# Patient Record
Sex: Male | Born: 1985 | Race: White | Hispanic: No | Marital: Single | Smoking: Current every day smoker
Health system: Southern US, Community
[De-identification: ages and names within clinical notes are randomized; demographics above are authoritative.]

## PROBLEM LIST (undated history)

## (undated) DIAGNOSIS — M5126 Other intervertebral disc displacement, lumbar region: Secondary | ICD-10-CM

## (undated) HISTORY — DX: Other intervertebral disc displacement, lumbar region: M51.26

---
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
## 2005-07-17 ENCOUNTER — Encounter: Admission: RE | Admit: 2005-07-17 | Discharge: 2005-10-15 | Payer: Self-pay | Admitting: Neurosurgery

## 2007-05-05 ENCOUNTER — Ambulatory Visit (HOSPITAL_COMMUNITY): Admission: RE | Admit: 2007-05-05 | Discharge: 2007-05-05 | Payer: Self-pay | Admitting: Internal Medicine

## 2007-05-11 ENCOUNTER — Emergency Department (HOSPITAL_COMMUNITY): Admission: EM | Admit: 2007-05-11 | Discharge: 2007-05-11 | Payer: Self-pay | Admitting: Emergency Medicine

## 2009-01-11 ENCOUNTER — Inpatient Hospital Stay (HOSPITAL_COMMUNITY): Admission: EM | Admit: 2009-01-11 | Discharge: 2009-01-15 | Payer: Self-pay | Admitting: Emergency Medicine

## 2009-04-26 ENCOUNTER — Ambulatory Visit (HOSPITAL_COMMUNITY): Admission: RE | Admit: 2009-04-26 | Discharge: 2009-04-26 | Payer: Self-pay | Admitting: Otolaryngology

## 2010-04-12 ENCOUNTER — Ambulatory Visit (HOSPITAL_BASED_OUTPATIENT_CLINIC_OR_DEPARTMENT_OTHER)
Admission: RE | Admit: 2010-04-12 | Discharge: 2010-04-12 | Disposition: A | Discharge status: Home / Self Care | Payer: Worker's Compensation | Attending: Orthopedic Surgery | Admitting: Orthopedic Surgery

## 2010-04-12 DIAGNOSIS — M659 Synovitis and tenosynovitis, unspecified: Secondary | ICD-10-CM | POA: Insufficient documentation

## 2010-04-12 DIAGNOSIS — X58XXXA Exposure to other specified factors, initial encounter: Secondary | ICD-10-CM | POA: Insufficient documentation

## 2010-04-12 DIAGNOSIS — Z87828 Personal history of other (healed) physical injury and trauma: Secondary | ICD-10-CM | POA: Insufficient documentation

## 2010-04-12 DIAGNOSIS — M24473 Recurrent dislocation, unspecified ankle: Secondary | ICD-10-CM | POA: Insufficient documentation

## 2010-04-12 DIAGNOSIS — M65979 Unspecified synovitis and tenosynovitis, unspecified ankle and foot: Secondary | ICD-10-CM | POA: Insufficient documentation

## 2010-04-12 DIAGNOSIS — Q742 Other congenital malformations of lower limb(s), including pelvic girdle: Secondary | ICD-10-CM | POA: Insufficient documentation

## 2010-04-12 DIAGNOSIS — Z01812 Encounter for preprocedural laboratory examination: Secondary | ICD-10-CM | POA: Insufficient documentation

## 2010-04-12 DIAGNOSIS — D1620 Benign neoplasm of long bones of unspecified lower limb: Secondary | ICD-10-CM | POA: Insufficient documentation

## 2010-04-12 LAB — POCT HEMOGLOBIN-HEMACUE: Hemoglobin: 17.4 g/dL — ABNORMAL HIGH (ref 13.0–17.0)
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
## 2010-06-14 LAB — BASIC METABOLIC PANEL
BUN: 10 mg/dL (ref 6–23)
CO2: 26 mEq/L (ref 19–32)
Calcium: 8.8 mg/dL (ref 8.4–10.5)
Chloride: 102 mEq/L (ref 96–112)
Creatinine, Ser: 0.95 mg/dL (ref 0.4–1.5)
GFR calc Af Amer: >60 mL/min (ref >60)
GFR calc non Af Amer: >60 mL/min (ref >60)
Glucose, Bld: 117 mg/dL — ABNORMAL HIGH (ref 70–99)
Potassium: 3.9 mEq/L (ref 3.5–5.1)
Sodium: 135 mEq/L (ref 135–145)

## 2010-06-14 LAB — COMPREHENSIVE METABOLIC PANEL
ALT: 19 U/L (ref 0–53)
AST: 26 U/L (ref 0–37)
Albumin: 4.4 g/dL (ref 3.5–5.2)
Alkaline Phosphatase: 53 U/L (ref 39–117)
BUN: 9 mg/dL (ref 6–23)
CO2: 26 mEq/L (ref 19–32)
Calcium: 9.4 mg/dL (ref 8.4–10.5)
Chloride: 105 mEq/L (ref 96–112)
Creatinine, Ser: 1.14 mg/dL (ref 0.4–1.5)
GFR calc Af Amer: >60 mL/min (ref >60)
GFR calc non Af Amer: >60 mL/min (ref >60)
Glucose, Bld: 121 mg/dL — ABNORMAL HIGH (ref 70–99)
Potassium: 3.4 mEq/L — ABNORMAL LOW (ref 3.5–5.1)
Sodium: 139 mEq/L (ref 135–145)
Total Bilirubin: 1 mg/dL (ref 0.3–1.2)
Total Protein: 7.2 g/dL (ref 6.0–8.3)

## 2010-06-14 LAB — CBC
HCT: 41.3 % (ref 39.0–52.0)
HCT: 44.3 % (ref 39.0–52.0)
Hemoglobin: 14.3 g/dL (ref 13.0–17.0)
Hemoglobin: 15.6 g/dL (ref 13.0–17.0)
MCHC: 34.8 g/dL (ref 30.0–36.0)
MCHC: 35.3 g/dL (ref 30.0–36.0)
MCV: 90.6 fL (ref 78.0–100.0)
MCV: 91.5 fL (ref 78.0–100.0)
Platelets: 217 10*3/uL (ref 150–400)
Platelets: 261 10*3/uL (ref 150–400)
RBC: 4.51 MIL/uL (ref 4.22–5.81)
RBC: 4.89 MIL/uL (ref 4.22–5.81)
RDW: 12.5 % (ref 11.5–15.5)
RDW: 13 % (ref 11.5–15.5)
WBC: 12.5 10*3/uL — ABNORMAL HIGH (ref 4.0–10.5)
WBC: 9.6 10*3/uL (ref 4.0–10.5)

## 2010-06-14 LAB — TYPE AND SCREEN
ABO/RH(D): A NEG
Antibody Screen: NEGATIVE

## 2010-06-14 LAB — POCT I-STAT, CHEM 8
BUN: 9 mg/dL (ref 6–23)
Calcium, Ion: 1.03 mmol/L — ABNORMAL LOW (ref 1.12–1.32)
Chloride: 106 mEq/L (ref 96–112)
Creatinine, Ser: 0.9 mg/dL (ref 0.4–1.5)
Glucose, Bld: 118 mg/dL — ABNORMAL HIGH (ref 70–99)
HCT: 45 % (ref 39.0–52.0)
Hemoglobin: 15.3 g/dL (ref 13.0–17.0)
Potassium: 3.5 mEq/L (ref 3.5–5.1)
Sodium: 139 mEq/L (ref 135–145)
TCO2: 23 mmol/L (ref 0–100)

## 2010-06-14 LAB — PROTIME-INR
INR: 1.09 (ref 0.00–1.49)
Prothrombin Time: 14 seconds (ref 11.6–15.2)

## 2010-06-14 LAB — APTT: aPTT: 26 seconds (ref 24–37)

## 2010-06-14 LAB — ETHANOL: Alcohol, Ethyl (B): <5 mg/dL (ref 0–10)

## 2010-06-14 LAB — ABO/RH: ABO/RH(D): A NEG

## 2010-06-14 LAB — LACTIC ACID, PLASMA: Lactic Acid, Venous: 1.9 mmol/L (ref 0.5–2.2)
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
## 2010-10-11 ENCOUNTER — Ambulatory Visit (HOSPITAL_BASED_OUTPATIENT_CLINIC_OR_DEPARTMENT_OTHER)
Admission: RE | Admit: 2010-10-11 | Discharge: 2010-10-11 | Disposition: A | Discharge status: Home / Self Care | Payer: Worker's Compensation | Source: Ambulatory Visit | Attending: Orthopedic Surgery | Admitting: Orthopedic Surgery

## 2010-10-11 DIAGNOSIS — M7750 Other enthesopathy of unspecified foot: Secondary | ICD-10-CM | POA: Insufficient documentation

## 2010-10-11 DIAGNOSIS — M25579 Pain in unspecified ankle and joints of unspecified foot: Secondary | ICD-10-CM | POA: Insufficient documentation

## 2010-10-11 DIAGNOSIS — Z01812 Encounter for preprocedural laboratory examination: Secondary | ICD-10-CM | POA: Insufficient documentation

## 2010-10-11 DIAGNOSIS — Y9269 Other specified industrial and construction area as the place of occurrence of the external cause: Secondary | ICD-10-CM | POA: Insufficient documentation

## 2010-10-11 DIAGNOSIS — X58XXXS Exposure to other specified factors, sequela: Secondary | ICD-10-CM | POA: Insufficient documentation

## 2010-10-11 LAB — POCT HEMOGLOBIN-HEMACUE: Hemoglobin: 17.9 g/dL — ABNORMAL HIGH (ref 13.0–17.0)
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
## 2015-02-10 ENCOUNTER — Encounter: Payer: Self-pay | Admitting: Internal Medicine

## 2015-02-10 ENCOUNTER — Ambulatory Visit (INDEPENDENT_AMBULATORY_CARE_PROVIDER_SITE_OTHER): Payer: BLUE CROSS/BLUE SHIELD | Admitting: Internal Medicine

## 2015-02-10 VITALS — BP 124/68 | HR 74 | Temp 98.0°F | Resp 18 | Ht 68.0 in | Wt 206.0 lb

## 2015-02-10 DIAGNOSIS — H9202 Otalgia, left ear: Secondary | ICD-10-CM | POA: Diagnosis not present

## 2015-02-10 DIAGNOSIS — J019 Acute sinusitis, unspecified: Secondary | ICD-10-CM | POA: Diagnosis not present

## 2015-02-10 MED ORDER — AMOXICILLIN 500 MG PO CAPS: 500.0000 mg | ORAL_CAPSULE | Freq: Three times a day (TID) | ORAL | Status: DC

## 2015-02-10 MED ORDER — FLUTICASONE PROPIONATE 50 MCG/ACT NA SUSP: 2.0000 | Freq: Every day | NASAL | Status: DC

## 2015-02-10 MED ORDER — PREDNISONE 20 MG PO TABS: ORAL_TABLET | ORAL | Status: DC

## 2015-02-10 NOTE — Patient Instructions (Signed)
Earache An earache, also called otalgia, can be caused by many things. Pain from an earache can be sharp, dull, or burning. The pain may be temporary or constant. Earaches can be caused by problems with the ear, such as infection in either the middle ear or the ear canal, injury, impacted ear wax, middle ear pressure, or a foreign body in the ear. Ear pain can also result from problems in other areas. This is called referred pain. For example, pain can come from a sore throat, a tooth infection, or problems with the jaw or the joint between the jaw and the skull (temporomandibular joint, or TMJ). The cause of an earache is not always easy to identify. Watchful waiting may be appropriate for some earaches until a clear cause of the pain can be found. HOME CARE INSTRUCTIONS Watch your condition for any changes. The following actions may help to lessen any discomfort that you are feeling:  Take medicines only as directed by your health care provider. This includes ear drops.  Apply ice to your outer ear to help reduce pain.  Put ice in a plastic bag.  Place a towel between your skin and the bag.  Leave the ice on for 20 minutes, 2-3 times per day.  Do not put anything in your ear other than medicine that is prescribed by your health care provider.  Try resting in an upright position instead of lying down. This may help to reduce pressure in the middle ear and relieve pain.  Chew gum if it helps to relieve your ear pain.  Control any allergies that you have.  Keep all follow-up visits as directed by your health care provider. This is important. SEEK MEDICAL CARE IF:  Your pain does not improve within 2 days.  You have a fever.  You have new or worsening symptoms. SEEK IMMEDIATE MEDICAL CARE IF:  You have a severe headache.  You have a stiff neck.  You have difficulty swallowing.  You have redness or swelling behind your ear.  You have drainage from your ear.  You have hearing  loss.  You feel dizzy.   This information is not intended to replace advice given to you by your health care provider. Make sure you discuss any questions you have with your health care provider.   Document Released: 10/14/2003 Document Revised: 03/19/2014 Document Reviewed: 09/27/2013 Elsevier Interactive Patient Education 2016 [employer]  

## 2015-02-10 NOTE — Progress Notes (Signed)
   Subjective:    Patient ID: [name], male    DOB: 30-May-1985, 30 y.o.   MRN: [id number]  Otalgia  Associated symptoms include rhinorrhea. Pertinent negatives include no ear discharge or sore throat.  Patient presents to the office for evaluation of left sided ear pain which started yesterday evening.  He reports that he had a very sharp pain in his ear and then felt a pop.  He reports that it came on very quickly and has continued to be a dull aching.  He reports that he is now having some echoing.  He did try using some ear drops that he had at home.  He reports that he is due to get on a plane next week and wanted to make sure it was okay.  Not generally in pools frequently.  He reports that he has had a lot of runny nose, sinus pressure recently.      Review of Systems  Constitutional: Negative for fever, chills and fatigue.  HENT: Positive for congestion, ear pain, postnasal drip, rhinorrhea and sinus pressure. Negative for ear discharge and sore throat.   Respiratory: Negative for chest tightness and shortness of breath.   Cardiovascular: Negative for chest pain and palpitations.       Objective:   Physical Exam  Constitutional: He appears well-developed and well-nourished. No distress.  HENT:  Head: Normocephalic.  Left Ear: No drainage. Tympanic membrane is injected, erythematous and bulging. Tympanic membrane is not scarred, not perforated and not retracted. A middle ear effusion is present.  Nose: Mucosal edema present. Right sinus exhibits no maxillary sinus tenderness and no frontal sinus tenderness. Left sinus exhibits no maxillary sinus tenderness and no frontal sinus tenderness.  Mouth/Throat: Oropharynx is clear and moist. No oropharyngeal exudate.  Eyes: Conjunctivae are normal. No scleral icterus.  Neck: Normal range of motion. Neck supple. No JVD present. No thyromegaly present.  Cardiovascular: Normal rate, regular rhythm and intact distal pulses.  Exam reveals  no gallop and no friction rub.   No murmur heard. Pulmonary/Chest: Effort normal and breath sounds normal. No respiratory distress. He has no wheezes. He has no rales. He exhibits no tenderness.  Lymphadenopathy:    He has no cervical adenopathy.  Skin: He is not diaphoretic.  Nursing note and vitals reviewed.   Filed Vitals:   02/10/15 1018  BP: 124/68  Pulse: 74  Temp: 98 F (36.7 C)  Resp: 18         Assessment & Plan:    1. Otalgia, left -nasal saline -claritin or zyrtec  - predniSONE (DELTASONE) 20 MG tablet; 3 tabs po day one, then 2 tabs daily x 4 days  Dispense: 11 tablet; Refill: 0 - fluticasone (FLONASE) 50 MCG/ACT nasal spray; Place 2 sprays into both nostrils daily.  Dispense: 16 g; Refill: 0 - amoxicillin (AMOXIL) 500 MG capsule; Take 1 capsule (500 mg total) by mouth 3 (three) times daily.  Dispense: 21 capsule; Refill: 0  2. Acute sinusitis, recurrence not specified, unspecified location -claritin or zyrtec -nasal saline - predniSONE (DELTASONE) 20 MG tablet; 3 tabs po day one, then 2 tabs daily x 4 days  Dispense: 11 tablet; Refill: 0 - fluticasone (FLONASE) 50 MCG/ACT nasal spray; Place 2 sprays into both nostrils daily.  Dispense: 16 g; Refill: 0 - amoxicillin (AMOXIL) 500 MG capsule; Take 1 capsule (500 mg total) by mouth 3 (three) times daily.  Dispense: 21 capsule; Refill: 0

## 2015-04-04 ENCOUNTER — Ambulatory Visit (INDEPENDENT_AMBULATORY_CARE_PROVIDER_SITE_OTHER): Payer: BLUE CROSS/BLUE SHIELD | Admitting: Internal Medicine

## 2015-04-04 ENCOUNTER — Encounter: Payer: Self-pay | Admitting: Internal Medicine

## 2015-04-04 VITALS — BP 122/80 | HR 64 | Temp 98.0°F | Resp 16 | Ht 70.0 in | Wt 200.0 lb

## 2015-04-04 DIAGNOSIS — Z79899 Other long term (current) drug therapy: Secondary | ICD-10-CM | POA: Diagnosis not present

## 2015-04-04 DIAGNOSIS — Z136 Encounter for screening for cardiovascular disorders: Secondary | ICD-10-CM

## 2015-04-04 DIAGNOSIS — Z130 Encounter for screening for diseases of the blood and blood-forming organs and certain disorders involving the immune mechanism: Secondary | ICD-10-CM

## 2015-04-04 DIAGNOSIS — F17200 Nicotine dependence, unspecified, uncomplicated: Secondary | ICD-10-CM

## 2015-04-04 DIAGNOSIS — K219 Gastro-esophageal reflux disease without esophagitis: Secondary | ICD-10-CM

## 2015-04-04 DIAGNOSIS — Z131 Encounter for screening for diabetes mellitus: Secondary | ICD-10-CM

## 2015-04-04 DIAGNOSIS — Z720 Tobacco use: Secondary | ICD-10-CM | POA: Insufficient documentation

## 2015-04-04 DIAGNOSIS — E559 Vitamin D deficiency, unspecified: Secondary | ICD-10-CM

## 2015-04-04 DIAGNOSIS — Z13220 Encounter for screening for lipoid disorders: Secondary | ICD-10-CM

## 2015-04-04 DIAGNOSIS — Z1389 Encounter for screening for other disorder: Secondary | ICD-10-CM

## 2015-04-04 DIAGNOSIS — Z0000 Encounter for general adult medical examination without abnormal findings: Secondary | ICD-10-CM

## 2015-04-04 DIAGNOSIS — Z1329 Encounter for screening for other suspected endocrine disorder: Secondary | ICD-10-CM

## 2015-04-04 LAB — CBC WITH DIFFERENTIAL/PLATELET
Basophils Absolute: 0.1 10*3/uL (ref 0.0–0.1)
Basophils Relative: 1 % (ref 0–1)
Eosinophils Absolute: 0.2 10*3/uL (ref 0.0–0.7)
Eosinophils Relative: 3 % (ref 0–5)
HCT: 48.1 % (ref 39.0–52.0)
Hemoglobin: 17.2 g/dL — ABNORMAL HIGH (ref 13.0–17.0)
Lymphocytes Relative: 29 % (ref 12–46)
Lymphs Abs: 1.8 10*3/uL (ref 0.7–4.0)
MCH: 32 pg (ref 26.0–34.0)
MCHC: 35.8 g/dL (ref 30.0–36.0)
MCV: 89.4 fL (ref 78.0–100.0)
MPV: 9.3 fL (ref 8.6–12.4)
Monocytes Absolute: 0.4 10*3/uL (ref 0.1–1.0)
Monocytes Relative: 6 % (ref 3–12)
Neutro Abs: 3.8 10*3/uL (ref 1.7–7.7)
Neutrophils Relative %: 61 % (ref 43–77)
Platelets: 250 10*3/uL (ref 150–400)
RBC: 5.38 MIL/uL (ref 4.22–5.81)
RDW: 13.4 % (ref 11.5–15.5)
WBC: 6.2 10*3/uL (ref 4.0–10.5)

## 2015-04-04 LAB — URINALYSIS, ROUTINE W REFLEX MICROSCOPIC
Bilirubin Urine: NEGATIVE
Glucose, UA: NEGATIVE
Hgb urine dipstick: NEGATIVE
Ketones, ur: NEGATIVE
Leukocytes, UA: NEGATIVE
Nitrite: NEGATIVE
Protein, ur: NEGATIVE
Specific Gravity, Urine: 1.018 (ref 1.001–1.035)
pH: 5.5 (ref 5.0–8.0)

## 2015-04-04 LAB — HEPATIC FUNCTION PANEL
ALT: 21 U/L (ref 9–46)
AST: 17 U/L (ref 10–40)
Albumin: 4.5 g/dL (ref 3.6–5.1)
Alkaline Phosphatase: 42 U/L (ref 40–115)
Bilirubin, Direct: 0.2 mg/dL (ref <=0.2)
Indirect Bilirubin: 0.8 mg/dL (ref 0.2–1.2)
Total Bilirubin: 1 mg/dL (ref 0.2–1.2)
Total Protein: 7.1 g/dL (ref 6.1–8.1)

## 2015-04-04 LAB — BASIC METABOLIC PANEL WITH GFR
BUN: 12 mg/dL (ref 7–25)
CO2: 24 mmol/L (ref 20–31)
Calcium: 9.7 mg/dL (ref 8.6–10.3)
Chloride: 103 mmol/L (ref 98–110)
Creat: 1.06 mg/dL (ref 0.60–1.35)
GFR, Est African American: >89 mL/min (ref >=60)
GFR, Est Non African American: >89 mL/min (ref >=60)
Glucose, Bld: 86 mg/dL (ref 65–99)
Potassium: 4.6 mmol/L (ref 3.5–5.3)
Sodium: 140 mmol/L (ref 135–146)

## 2015-04-04 LAB — LIPID PANEL
Cholesterol: 164 mg/dL (ref 125–200)
HDL: 31 mg/dL — ABNORMAL LOW (ref >=40)
LDL Cholesterol: 103 mg/dL (ref <130)
Total CHOL/HDL Ratio: 5.3 Ratio — ABNORMAL HIGH (ref <=5.0)
Triglycerides: 148 mg/dL (ref <150)
VLDL: 30 mg/dL (ref <30)

## 2015-04-04 LAB — TSH: TSH: 1.179 u[IU]/mL (ref 0.350–4.500)

## 2015-04-04 LAB — MICROALBUMIN / CREATININE URINE RATIO
Creatinine, Urine: 191 mg/dL (ref 20–370)
Microalb Creat Ratio: 1 mcg/mg creat (ref <30)
Microalb, Ur: 0.2 mg/dL

## 2015-04-04 LAB — MAGNESIUM: Magnesium: 2 mg/dL (ref 1.5–2.5)

## 2015-04-04 LAB — HEMOGLOBIN A1C
Hgb A1c MFr Bld: 5.3 % (ref <5.7)
Mean Plasma Glucose: 105 mg/dL (ref <117)

## 2015-04-04 LAB — VITAMIN B12: Vitamin B-12: 512 pg/mL (ref 211–911)

## 2015-04-04 LAB — IRON AND TIBC
%SAT: 46 % (ref 15–60)
Iron: 150 ug/dL (ref 50–180)
TIBC: 325 ug/dL (ref 250–425)
UIBC: 175 ug/dL (ref 125–400)

## 2015-04-04 MED ORDER — VARENICLINE TARTRATE 0.5 MG PO TABS: 0.5000 mg | ORAL_TABLET | Freq: Two times a day (BID) | ORAL | Status: DC

## 2015-04-04 MED ORDER — RANITIDINE HCL 300 MG PO TABS: 300.0000 mg | ORAL_TABLET | Freq: Every day | ORAL | Status: DC

## 2015-04-04 NOTE — Progress Notes (Signed)
Patient ID: [name], male   DOB: May 29, 1985, 30 y.o.   MRN: [id number]    Annual Screening Comprehensive Examination   This very nice 30 y.o.male presents for complete physical.  Patient has no major health issues.  Patient reports no complaints at this time.   Patient reports that he would like to quit smoking.  He did take chantix and had some success for a year and a half.  He is back to smoking and smokes a pack a day.    Finally, patient has history of Vitamin D Deficiency and last vitamin D was No results found for: VD25OH.  Currently on supplementation.  He is currently going to the gym and doing cardio and weight lifting 5 days a week.  He also has a very physical job that requires him to do heavy lifting and also walking a lot.    He does occasionally have some acid reflux especially after eating spicy foods.  No history of reflux.  NO history of esophogeal or colon cancers.  Not taking NSAIDs per report.       No current outpatient prescriptions on file prior to visit.   No current facility-administered medications on file prior to visit.    Allergies not on file  No past medical history on file.   There is no immunization history on file for this patient.  No past surgical history on file.  No family history on file.  Social History   Social History  . Marital Status: Married    Spouse Name: N/A  . Number of Children: N/A  . Years of Education: N/A   Occupational History  . Not on file.   Social History Main Topics  . Smoking status: Current Every Day Smoker  . Smokeless tobacco: Not on file  . Alcohol Use: Not on file  . Drug Use: Not on file  . Sexual Activity: Not on file   Other Topics Concern  . Not on file   Social History Narrative   Review of Systems  Constitutional: Negative for fever, chills and malaise/fatigue.  HENT: Negative for congestion, ear pain and sore throat.   Eyes: Negative.   Respiratory: Negative for cough,  shortness of breath and wheezing.   Cardiovascular: Negative for chest pain, palpitations and leg swelling.  Gastrointestinal: Positive for heartburn. Negative for diarrhea, constipation, blood in stool and melena.  Genitourinary: Negative.   Skin: Negative.   Neurological: Negative for dizziness, sensory change, loss of consciousness and headaches.  Psychiatric/Behavioral: Negative for depression. The patient is not nervous/anxious and does not have insomnia.       Physical Exam  BP 122/80 mmHg  Pulse 64  Temp(Src) 98 F (36.7 C) (Temporal)  Resp 16  Ht 5\' 10"  (1.778 m)  Wt 200 lb (90.719 kg)  BMI 28.70 kg/m2  General Appearance: Well nourished and in no apparent distress. Eyes: PERRLA, EOMs, conjunctiva no swelling or erythema, normal fundi and vessels. Sinuses: No frontal/maxillary tenderness ENT/Mouth: EACs patent / TMs  nl. Nares clear without erythema, swelling, mucoid exudates. Oral hygiene is good. No erythema, swelling, or exudate. Tongue normal, non-obstructing. Tonsils not swollen or erythematous. Hearing normal.  Neck: Supple, thyroid normal. No bruits, nodes or JVD. Respiratory: Respiratory effort normal.  BS equal and clear bilateral without rales, rhonci, wheezing or stridor. Cardio: Heart sounds are normal with regular rate and rhythm and no murmurs, rubs or gallops. Peripheral pulses are normal and equal bilaterally without edema. No aortic or femoral bruits. Chest:  symmetric with normal excursions and percussion. Abdomen: Flat, soft, with bowl sounds. Nontender, no guarding, rebound, hernias, masses, or organomegaly.  Lymphatics: Non tender without lymphadenopathy.  Musculoskeletal: Full ROM all peripheral extremities, joint stability, 5/5 strength, and normal gait. Skin: Warm and dry without rashes, lesions, cyanosis, clubbing or  ecchymosis.  Neuro: Cranial nerves intact, reflexes equal bilaterally. Normal muscle tone, no cerebellar symptoms. Sensation intact.   Pysch: Awake and oriented X 3, normal affect, Insight and Judgment appropriate.   Assessment and Plan    1. Routine general medical examination at a health care facility   2. Screening for diabetes mellitus (DM)  - Hemoglobin A1c - Insulin, random  3. Screening for hyperlipidemia  - Lipid panel  4. Screening for deficiency anemia  - Iron and TIBC - Vitamin B12  5. Screening for hematuria or proteinuria  - Urinalysis, Routine w reflex microscopic (not at [facility]) - Microalbumin / creatinine urine ratio  6. Screening for cardiovascular condition  - EKG 12-Lead  7. Vitamin D deficiency  - VITAMIN D 25 Hydroxy (Vit-D Deficiency, Fractures)  8. Screening for thyroid disorder  - TSH  9. Medication management  - CBC with Differential/Platelet - BASIC METABOLIC PANEL WITH GFR - Hepatic function panel - Magnesium  10. Tobacco use disorder  - varenicline (CHANTIX) 0.5 MG tablet; Take 1 tablet (0.5 mg total) by mouth 2 (two) times daily.  Dispense: 90 tablet; Refill: 1  11. Gastroesophageal reflux disease, esophagitis presence not specified -avoid trigger foods - ranitidine (ZANTAC) 300 MG tablet; Take 1 tablet (300 mg total) by mouth at bedtime.  Dispense: 30 tablet; Refill: 1   Patient does likely have an UTD tetanus as patient did fall off a roof in 2010 and had several major surgeries.  Will check on this in chart.    Continue prudent diet as discussed, weight control, regular exercise, and medications. Routine screening labs and tests as requested with regular follow-up as recommended.  Over 40 minutes of exam, counseling, chart review and critical decision making was performed

## 2015-04-04 NOTE — Patient Instructions (Signed)
Food Choices for Gastroesophageal Reflux Disease, Adult When you have gastroesophageal reflux disease (GERD), the foods you eat and your eating habits are very important. Choosing the right foods can help ease the discomfort of GERD. WHAT GENERAL GUIDELINES DO I NEED TO FOLLOW?  Choose fruits, vegetables, whole grains, low-fat dairy products, and low-fat meat, fish, and poultry.  Limit fats such as oils, salad dressings, butter, nuts, and avocado.  Keep a food diary to identify foods that cause symptoms.  Avoid foods that cause reflux. These may be different for different people.  Eat frequent small meals instead of three large meals each day.  Eat your meals slowly, in a relaxed setting.  Limit fried foods.  Cook foods using methods other than frying.  Avoid drinking alcohol.  Avoid drinking large amounts of liquids with your meals.  Avoid bending over or lying down until 2-3 hours after eating. WHAT FOODS ARE NOT RECOMMENDED? The following are some foods and drinks that may worsen your symptoms: Vegetables Tomatoes. Tomato juice. Tomato and spaghetti sauce. Chili peppers. Onion and garlic. Horseradish. Fruits Oranges, grapefruit, and lemon (fruit and juice). Meats High-fat meats, fish, and poultry. This includes hot dogs, ribs, ham, sausage, salami, and bacon. Dairy Whole milk and chocolate milk. Sour cream. Cream. Butter. Ice cream. Cream cheese.  Beverages Coffee and tea, with or without caffeine. Carbonated beverages or energy drinks. Condiments Hot sauce. Barbecue sauce.  Sweets/Desserts Chocolate and cocoa. Donuts. Peppermint and spearmint. Fats and Oils High-fat foods, including [place] fries and potato chips. Other Vinegar. Strong spices, such as black pepper, white pepper, red pepper, cayenne, curry powder, cloves, ginger, and chili powder. The items listed above may not be a complete list of foods and beverages to avoid. Contact your dietitian for more  information.   This information is not intended to replace advice given to you by your health care provider. Make sure you discuss any questions you have with your health care provider.   Document Released: 02/26/2005 Document Revised: 03/19/2014 Document Reviewed: 12/31/2012 Elsevier Interactive Patient Education 2016 [place] for Adults  A healthy lifestyle and preventive care can promote health and wellness. Preventive health guidelines for men include the following key practices:  A routine yearly physical is a good way to check with your health care provider about your health and preventative screening. It is a chance to share any concerns and updates on your health and to receive a thorough exam.  Visit your dentist for a routine exam and preventative care every 6 months. Brush your teeth twice a day and floss once a day. Good oral hygiene prevents tooth decay and gum disease.  The frequency of eye exams is based on your age, health, family medical history, use of contact lenses, and other factors. Follow your health care provider's recommendations for frequency of eye exams.  Eat a healthy diet. Foods such as vegetables, fruits, whole grains, low-fat dairy products, and lean protein foods contain the nutrients you need without too many calories. Decrease your intake of foods high in solid fats, added sugars, and salt. Eat the right amount of calories for you.Get information about a proper diet from your health care provider, if necessary.  Regular physical exercise is one of the most important things you can do for your health. Most adults should get at least 150 minutes of moderate-intensity exercise (any activity that increases your heart rate and causes you to sweat) each week. In addition, most adults need muscle-strengthening exercises  on 2 or more days a week.  Maintain a healthy weight. The body mass index (BMI) is a screening tool to identify possible  weight problems. It provides an estimate of body fat based on height and weight. Your health care provider can find your BMI and can help you achieve or maintain a healthy weight.For adults 20 years and older:  A BMI below 18.5 is considered underweight.  A BMI of 18.5 to 24.9 is normal.  A BMI of 25 to 29.9 is considered overweight.  A BMI of 30 and above is considered obese.  Maintain normal blood lipids and cholesterol levels by exercising and minimizing your intake of saturated fat. Eat a balanced diet with plenty of fruit and vegetables. Blood tests for lipids and cholesterol should begin at age 62 and be repeated every 5 years. If your lipid or cholesterol levels are high, you are over 50, or you are at high risk for heart disease, you may need your cholesterol levels checked more frequently.Ongoing high lipid and cholesterol levels should be treated with medicines if diet and exercise are not working.  If you smoke, find out from your health care provider how to quit. If you do not use tobacco, do not start.  Lung cancer screening is recommended for adults aged 67-80 years who are at high risk for developing lung cancer because of a history of smoking. A yearly low-dose CT scan of the lungs is recommended for people who have at least a 30-pack-year history of smoking and are a current smoker or have quit within the past 15 years. A pack year of smoking is smoking an average of 1 pack of cigarettes a day for 1 year (for example: 1 pack a day for 30 years or 2 packs a day for 15 years). Yearly screening should continue until the smoker has stopped smoking for at least 15 years. Yearly screening should be stopped for people who develop a health problem that would prevent them from having lung cancer treatment.  If you choose to drink alcohol, do not have more than 2 drinks per day. One drink is considered to be 12 ounces (355 mL) of beer, 5 ounces (148 mL) of wine, or 1.5 ounces (44 mL) of  liquor.  High blood pressure causes heart disease and increases the risk of stroke. Your blood pressure should be checked. Ongoing high blood pressure should be treated with medicines, if weight loss and exercise are not effective.  If you are 33-51 years old, ask your health care provider if you should take aspirin to prevent heart disease.  Diabetes screening involves taking a blood sample to check your fasting blood sugar level. Testing should be considered at a younger age or be carried out more frequently if you are overweight and have at least 1 risk factor for diabetes.  Colorectal cancer can be detected and often prevented. Most routine colorectal cancer screening begins at the age of 69 and continues through age 2. However, your health care provider may recommend screening at an earlier age if you have risk factors for colon cancer. On a yearly basis, your health care provider may provide home test kits to check for hidden blood in the stool. Use of a small camera at the end of a tube to directly examine the colon (sigmoidoscopy or colonoscopy) can detect the earliest forms of colorectal cancer. Talk to your health care provider about this at age 20, when routine screening begins. Direct exam of the colon should  be repeated every 5-10 years through age 70, unless early forms of precancerous polyps or small growths are found.  Screening for abdominal aortic aneurysm (AAA)  are recommended for persons over age 45 who have history of hypertensionor who are current or former smokers.  Talk with your health care provider about prostate cancer screening.  Testicular cancer screening is recommended for adult males. Screening includes self-exam, a health care provider exam, and other screening tests. Consult with your health care provider about any symptoms you have or any concerns you have about testicular cancer.  Use sunscreen. Apply sunscreen liberally and repeatedly throughout the day. You  should seek shade when your shadow is shorter than you. Protect yourself by wearing long sleeves, pants, a wide-brimmed hat, and sunglasses year round, whenever you are outdoors.  Once a month, do a whole-body skin exam, using a mirror to look at the skin on your back. Tell your health care provider about new moles, moles that have irregular borders, moles that are larger than a pencil eraser, or moles that have changed in shape or color.  Stay current with required vaccines (immunizations).  Influenza vaccine. All adults should be immunized every year.  Tetanus, diphtheria, and acellular pertussis (Td, Tdap) vaccine. An adult who has not previously received Tdap or who does not know his vaccine status should receive 1 dose of Tdap. This initial dose should be followed by tetanus and diphtheria toxoids (Td) booster doses every 10 years. Adults with an unknown or incomplete history of completing a 3-dose immunization series with Td-containing vaccines should begin or complete a primary immunization series including a Tdap dose. Adults should receive a Td booster every 10 years.  Zoster vaccine. One dose is recommended for adults aged 38 years or older unless certain conditions are present.    Pneumococcal 13-valent conjugate (PCV13) vaccine. When indicated, a person who is uncertain of his immunization history and has no record of immunization should receive the PCV13 vaccine. An adult aged 6 years or older who has certain medical conditions and has not been previously immunized should receive 1 dose of PCV13 vaccine. This PCV13 should be followed with a dose of pneumococcal polysaccharide (PPSV23) vaccine. The PPSV23 vaccine dose should be obtained at least 8 weeks after the dose of PCV13 vaccine. An adult aged 36 years or older who has certain medical conditions and previously received 1 or more doses of PPSV23 vaccine should receive 1 dose of PCV13. The PCV13 vaccine dose should be obtained 1 or  more years after the last PPSV23 vaccine dose.    Pneumococcal polysaccharide (PPSV23) vaccine. When PCV13 is also indicated, PCV13 should be obtained first. All adults aged 23 years and older should be immunized. An adult younger than age 34 years who has certain medical conditions should be immunized. Any person who resides in a nursing home or long-term care facility should be immunized. An adult smoker should be immunized. People with an immunocompromised condition and certain other conditions should receive both PCV13 and PPSV23 vaccines. People with human immunodeficiency virus (HIV) infection should be immunized as soon as possible after diagnosis. Immunization during chemotherapy or radiation therapy should be avoided. Routine use of PPSV23 vaccine is not recommended for American Indians, [place] Natives, or people younger than 65 years unless there are medical conditions that require PPSV23 vaccine. When indicated, people who have unknown immunization and have no record of immunization should receive PPSV23 vaccine. One-time revaccination 5 years after the first dose of PPSV23 is recommended  for people aged 19-64 years who have chronic kidney failure, nephrotic syndrome, asplenia, or immunocompromised conditions. People who received 1-2 doses of PPSV23 before age 83 years should receive another dose of PPSV23 vaccine at age 41 years or later if at least 5 years have passed since the previous dose. Doses of PPSV23 are not needed for people immunized with PPSV23 at or after age 21 years.  Hepatitis A vaccine. Adults who wish to be protected from this disease, have certain high-risk conditions, work with hepatitis A-infected animals, work in hepatitis A research labs, or travel to or work in countries with a high rate of hepatitis A should be immunized. Adults who were previously unvaccinated and who anticipate close contact with an international adoptee during the first 60 days after arrival in the  [place] from a country with a high rate of hepatitis A should be immunized.  Hepatitis B vaccine. Adults should be immunized if they wish to be protected from this disease, have certain high-risk conditions, may be exposed to blood or other infectious body fluids, are household contacts or sex partners of hepatitis B positive people, are clients or workers in certain care facilities, or travel to or work in countries with a high rate of hepatitis B.  Preventive Service / Frequency  Ages 42 to 76  Blood pressure check.  Lipid and cholesterol check.  Hepatitis C blood test.** / For any individual with known risks for hepatitis C.  Skin self-exam. / Monthly.  Influenza vaccine. / Every year.  Tetanus, diphtheria, and acellular pertussis (Tdap, Td) vaccine.** / Consult your health care provider. 1 dose of Td every 10 years.  HPV vaccine. / 3 doses over 6 months, if 42 or younger.  Measles, mumps, rubella (MMR) vaccine.** / You need at least 1 dose of MMR if you were born in 1957 or later. You may also need a second dose.  Pneumococcal 13-valent conjugate (PCV13) vaccine.** / Consult your health care provider.  Pneumococcal polysaccharide (PPSV23) vaccine.** / 1 to 2 doses if you smoke cigarettes or if you have certain conditions.  Meningococcal vaccine.** / 1 dose if you are age 23 to 56 years and a Market researcher living in a residence hall, or have one of several medical conditions. You may also need additional booster doses.  Hepatitis A vaccine.** / Consult your health care provider.  Hepatitis B vaccine.** / Consult your health care provider.

## 2015-04-05 LAB — VITAMIN D 25 HYDROXY (VIT D DEFICIENCY, FRACTURES): Vit D, 25-Hydroxy: 33 ng/mL (ref 30–100)

## 2015-04-05 LAB — INSULIN, RANDOM: Insulin: 16.2 u[IU]/mL (ref 2.0–19.6)

## 2016-03-16 ENCOUNTER — Ambulatory Visit (INDEPENDENT_AMBULATORY_CARE_PROVIDER_SITE_OTHER): Payer: Managed Care, Other (non HMO) | Admitting: Internal Medicine

## 2016-03-16 ENCOUNTER — Encounter: Payer: Self-pay | Admitting: Internal Medicine

## 2016-03-16 VITALS — BP 126/80 | HR 82 | Temp 98.2°F | Resp 16 | Ht 70.0 in | Wt 200.0 lb

## 2016-03-16 DIAGNOSIS — M5431 Sciatica, right side: Secondary | ICD-10-CM

## 2016-03-16 MED ORDER — PREDNISONE 20 MG PO TABS: ORAL_TABLET | ORAL | 0 refills | Status: DC

## 2016-03-16 MED ORDER — PREGABALIN 50 MG PO CAPS: 50.0000 mg | ORAL_CAPSULE | Freq: Three times a day (TID) | ORAL | 0 refills | Status: DC

## 2016-03-16 MED ORDER — MELOXICAM 15 MG PO TABS: 15.0000 mg | ORAL_TABLET | Freq: Every day | ORAL | 1 refills | Status: DC

## 2016-03-16 NOTE — Progress Notes (Signed)
Subjective:    Patient ID: [name], male    DOB: 1985/12/22, 31 y.o.   MRN: [id number]  HPI  Patient presents to the office for evaluation of low back pain with radiation down the right buttocks for the last month and a half.  He reports that the pain started when he was jumping in a bounce house.  He notes that he had gradually increasing pain down the leg eventually ending up in having some numbness in the toes.  He reports that really now the pain is in the buttocks and right leg.  He has not had any weakness.  He has not been taking any medications as of this time.  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  He reports that he does try to stretch and also tried deep blue with little relief.    Review of Systems  Constitutional: Negative for chills, fatigue and fever.  Respiratory: Negative for chest tightness and shortness of breath.   Cardiovascular: Negative for chest pain and palpitations.  Gastrointestinal: Negative for abdominal pain, constipation, diarrhea, nausea and vomiting.  Musculoskeletal: Positive for back pain.  Neurological: Negative for numbness.       Objective:   Physical Exam  Constitutional: He is oriented to person, place, and time. He appears well-developed and well-nourished. No distress.  HENT:  Head: Normocephalic.  Mouth/Throat: Oropharynx is clear and moist. No oropharyngeal exudate.  Eyes: Conjunctivae are normal. No scleral icterus.  Neck: Normal range of motion. Neck supple. No JVD present. No thyromegaly present.  Cardiovascular: Normal rate, regular rhythm, normal heart sounds and intact distal pulses.  Exam reveals no gallop and no friction rub.   No murmur heard. Pulmonary/Chest: Effort normal and breath sounds normal. No respiratory distress. He has no wheezes. He has no rales. He exhibits no tenderness.  Abdominal: Soft. Bowel sounds are normal. He exhibits no distension and no mass. There is no tenderness. There is  no rebound and no guarding.  Musculoskeletal: Normal range of motion.  Patient rises slowly from sitting to standing.  They walk without an antalgic gait.  There is no evidence of erythema, ecchymosis, or gross deformity.  There is no tenderness to palpation over bony spine, gluteal muscles or hips.  No paraspinal tenderness to palpation.  Active ROM is full.  Sensation to light touch is intact over all extremities.  Strength is symmetric and equal in all extremities.  Positive straight leg test on right leg.     Lymphadenopathy:    He has no cervical adenopathy.  Neurological: He is alert and oriented to person, place, and time. No cranial nerve deficit. Coordination normal.  Skin: Skin is warm and dry. No rash noted. He is not diaphoretic.  Psychiatric: He has a normal mood and affect. His behavior is normal. Judgment and thought content normal.  Nursing note and vitals reviewed.    Vitals:   03/16/16 0919  BP: 126/80  Pulse: 82  Resp: 16  Temp: 98.2 F (36.8 C)          Assessment & Plan:    1. Sciatica of right side -no red flag symptoms for cauda equina -no treatment thus far -prednisone taper -meloxicam x 1 month after finishing course of prednisone -if no relief will refer to ortho -patient aware to go to the ER if symptoms of cauda equina which we discussed her in the office.  -lyrica 50 mg samples given to patient in the  office

## 2016-04-03 ENCOUNTER — Encounter: Payer: Self-pay | Admitting: Internal Medicine

## 2016-05-21 ENCOUNTER — Encounter: Payer: Self-pay | Admitting: Internal Medicine

## 2016-12-03 ENCOUNTER — Ambulatory Visit (INDEPENDENT_AMBULATORY_CARE_PROVIDER_SITE_OTHER): Payer: 59 | Admitting: Physician Assistant

## 2016-12-03 ENCOUNTER — Encounter: Payer: Self-pay | Admitting: Physician Assistant

## 2016-12-03 VITALS — BP 132/74 | HR 80 | Temp 98.1°F | Resp 14 | Ht 70.0 in | Wt 207.0 lb

## 2016-12-03 DIAGNOSIS — M5442 Lumbago with sciatica, left side: Secondary | ICD-10-CM

## 2016-12-03 DIAGNOSIS — G8929 Other chronic pain: Secondary | ICD-10-CM | POA: Diagnosis not present

## 2016-12-03 MED ORDER — CYCLOBENZAPRINE HCL 10 MG PO TABS: 10.0000 mg | ORAL_TABLET | Freq: Every day | ORAL | 0 refills | Status: DC

## 2016-12-03 MED ORDER — PREDNISONE 20 MG PO TABS: ORAL_TABLET | ORAL | 0 refills | Status: DC

## 2016-12-03 NOTE — Progress Notes (Signed)
Subjective:    Patient ID: [name], male    DOB: 01/10/1986, 31 y.o.   MRN: [id number]  HPI 31 y.o. WM presents with back pain. States that he has been having back pain x sept when he was in a bouncy house and states the next day he had severe back pain with right leg pain. Took prednisone/mobic in Jan and it did improve while on the medication but has gotten worse x 1 month and now down the left leg. Stays above the knee, very rarely to his calf. More activity the worse. Will do aleve occ.  Patient denies fever, hematuria, incontinence, numbness, tingling, weakness and saddle anesthesia  Blood pressure 132/74, pulse 80, temperature 98.1 F (36.7 C), resp. rate 14, height 5\' 10"  (1.778 m), weight 207 lb (93.9 kg), SpO2 96 %.  Medications No current outpatient prescriptions on file prior to visit.   No current facility-administered medications on file prior to visit.     Problem list He has Tobacco abuse on his problem list.'  Review of Systems  Constitutional: Negative for chills, fatigue and fever.  Respiratory: Negative for chest tightness and shortness of breath.   Cardiovascular: Negative for chest pain and palpitations.  Gastrointestinal: Negative for abdominal pain, constipation, diarrhea, nausea and vomiting.  Musculoskeletal: Positive for back pain.  Neurological: Negative for numbness.       Objective:   Physical Exam  Constitutional: He is oriented to person, place, and time. He appears well-developed and well-nourished. No distress.  HENT:  Head: Normocephalic.  Mouth/Throat: Oropharynx is clear and moist. No oropharyngeal exudate.  Eyes: Conjunctivae are normal. No scleral icterus.  Neck: Normal range of motion. Neck supple. No JVD present. No thyromegaly present.  Cardiovascular: Normal rate, regular rhythm, normal heart sounds and intact distal pulses.  Exam reveals no gallop and no friction rub.   No murmur heard. Pulmonary/Chest: Effort normal and  breath sounds normal. No respiratory distress. He has no wheezes. He has no rales. He exhibits no tenderness.  Abdominal: Soft. Bowel sounds are normal. He exhibits no distension and no mass. There is no tenderness. There is no rebound and no guarding.  Musculoskeletal: Normal range of motion.   They walk without an antalgic gait.  There is no evidence of erythema, ecchymosis, or gross deformity.  There is no tenderness to palpation over bony spine, gluteal muscles or hips.  No paraspinal tenderness to palpation.  Active ROM is full.  Sensation to light touch is intact over all extremities.  Strength is symmetric and equal in all extremities.  Positive straight leg test on left leg, + tight piriformis.    Lymphadenopathy:    He has no cervical adenopathy.  Neurological: He is alert and oriented to person, place, and time. No cranial nerve deficit. Coordination normal.  Skin: Skin is warm and dry. No rash noted. He is not diaphoretic.  Psychiatric: He has a normal mood and affect. His behavior is normal. Judgment and thought content normal.  Nursing note and vitals reviewed.      Assessment & Plan:   Chronic bilateral low back pain with left-sided sciatica -     Ambulatory referral to Orthopedics -     predniSONE (DELTASONE) 20 MG tablet; 3 tabs po daily x 3 days, then 2 tabs x 3 days, then 1.5 tabs x 3 days, then 1 tab x 3 days, then 0.5 tabs x 3 days -     cyclobenzaprine (FLEXERIL) 10 MG tablet; Take 1 tablet (  10 mg total) by mouth at bedtime. - stretches taught - go to ER if any worsening symptoms

## 2016-12-03 NOTE — Patient Instructions (Addendum)
Please take the prednisone to help decrease inflammation and therefore decrease symptoms. Take it it with food to avoid GI upset. It can cause increased energy but on the other hand it can make it hard to sleep at night so please take it AT [place], it takes 8-12 hours to start working so it will NOT affect your sleeping if you take it at night with your food!!  If you are diabetic it will increase your sugars so decrease carbs and monitor your sugars closely.      Try the exercises and other information in the back care manual, meloxicam once during the day as needed (avoid taking other NSAIDS like Alleve or Ibuprofen while taking this) and then flexeril if needed at bedtime for muscle spasm. This can be taken up to every 8 hours, but causes sedation, so should not drive or operate heavy machinery while taking this medicine.   Go to the ER if you have any new weakness in your legs, have trouble controlling your urine or bowels, or have worsening pain.     Back pain Rehab Ask your health care provider which exercises are safe for you. Do exercises exactly as told by your health care provider and adjust them as directed. It is normal to feel mild stretching, pulling, tightness, or discomfort as you do these exercises, but you should stop right away if you feel sudden pain or your pain gets worse.Do not begin these exercises until told by your health care provider. Stretching and range of motion exercises These exercises warm up your muscles and joints and improve the movement and flexibility of your hips and your back. These exercises also help to relieve pain, numbness, and tingling. Exercise A: Sciatic nerve glide 1. Sit in a chair with your head facing down toward your chest. Place your hands behind your back. Let your shoulders slump forward. 2. Slowly straighten one of your knees while you tilt your head back as if you are looking toward the ceiling. Only straighten your leg as far  as you can without making your symptoms worse. 3. Hold for __________ seconds. 4. Slowly return to the starting position. 5. Repeat with your other leg. Repeat __________ times. Complete this exercise __________ times a day. Exercise B: Knee to chest with hip adduction and internal rotation  1. Lie on your back on a firm surface with both legs straight. 2. Bend one of your knees and move it up toward your chest until you feel a gentle stretch in your lower back and buttock. Then, move your knee toward the shoulder that is on the opposite side from your leg. ? Hold your leg in this position by holding onto the front of your knee. 3. Hold for __________ seconds. 4. Slowly return to the starting position. 5. Repeat with your other leg. Repeat __________ times. Complete this exercise __________ times a day. Exercise C: Prone extension on elbows  1. Lie on your abdomen on a firm surface. A bed may be too soft for this exercise. 2. Prop yourself up on your elbows. 3. Use your arms to help lift your chest up until you feel a gentle stretch in your abdomen and your lower back. ? This will place some of your body weight on your elbows. If this is uncomfortable, try stacking pillows under your chest. ? Your hips should stay down, against the surface that you are lying on. Keep your hip and back muscles relaxed. 4. Hold for __________ seconds.  5. Slowly relax your upper body and return to the starting position. Repeat __________ times. Complete this exercise __________ times a day. Strengthening exercises These exercises build strength and endurance in your back. Endurance is the ability to use your muscles for a long time, even after they get tired. Exercise D: Pelvic tilt 1. Lie on your back on a firm surface. Bend your knees and keep your feet flat. 2. Tense your abdominal muscles. Tip your pelvis up toward the ceiling and flatten your lower back into the floor. ? To help with this exercise, you  may place a small towel under your lower back and try to push your back into the towel. 3. Hold for __________ seconds. 4. Let your muscles relax completely before you repeat this exercise. Repeat __________ times. Complete this exercise __________ times a day. Exercise E: Alternating arm and leg raises  1. Get on your hands and knees on a firm surface. If you are on a hard floor, you may want to use padding to cushion your knees, such as an exercise mat. 2. Line up your arms and legs. Your hands should be below your shoulders, and your knees should be below your hips. 3. Lift your left leg behind you. At the same time, raise your right arm and straighten it in front of you. ? Do not lift your leg higher than your hip. ? Do not lift your arm higher than your shoulder. ? Keep your abdominal and back muscles tight. ? Keep your hips facing the ground. ? Do not arch your back. ? Keep your balance carefully, and do not hold your breath. 4. Hold for __________ seconds. 5. Slowly return to the starting position and repeat with your right leg and your left arm. Repeat __________ times. Complete this exercise __________ times a day. Posture and body mechanics  Body mechanics refers to the movements and positions of your body while you do your daily activities. Posture is part of body mechanics. Good posture and healthy body mechanics can help to relieve stress in your body's tissues and joints. Good posture means that your spine is in its natural S-curve position (your spine is neutral), your shoulders are pulled back slightly, and your head is not tipped forward. The following are general guidelines for applying improved posture and body mechanics to your everyday activities. Standing   When standing, keep your spine neutral and your feet about hip-width apart. Keep a slight bend in your knees. Your ears, shoulders, and hips should line up.  When you do a task in which you stand in one place for a  long time, place one foot up on a stable object that is 2-4 inches (5-10 cm) high, such as a footstool. This helps keep your spine neutral. Sitting   When sitting, keep your spine neutral and keep your feet flat on the floor. Use a footrest, if necessary, and keep your thighs parallel to the floor. Avoid rounding your shoulders, and avoid tilting your head forward.  When working at a desk or a computer, keep your desk at a height where your hands are slightly lower than your elbows. Slide your chair under your desk so you are close enough to maintain good posture.  When working at a computer, place your monitor at a height where you are looking straight ahead and you do not have to tilt your head forward or downward to look at the screen. Resting   When lying down and resting, avoid positions that  are most painful for you.  If you have pain with activities such as sitting, bending, stooping, or squatting (flexion-based activities), lie in a position in which your body does not bend very much. For example, avoid curling up on your side with your arms and knees near your chest (fetal position).  If you have pain with activities such as standing for a long time or reaching with your arms (extension-based activities), lie with your spine in a neutral position and bend your knees slightly. Try the following positions: ? Lying on your side with a pillow between your knees. ? Lying on your back with a pillow under your knees. Lifting   When lifting objects, keep your feet at least shoulder-width apart and tighten your abdominal muscles.  Bend your knees and hips and keep your spine neutral. It is important to lift using the strength of your legs, not your back. Do not lock your knees straight out.  Always ask for help to lift heavy or awkward objects. This information is not intended to replace advice given to you by your health care provider. Make sure you discuss any questions you have with  your health care provider. Document Released: 02/26/2005 Document Revised: 11/03/2015 Document Reviewed: 11/12/2014 Elsevier Interactive Patient Education  2018 [employer].    Piriformis Syndrome Piriformis syndrome is a condition that can cause pain and numbness in your buttocks and down the back of your leg. Piriformis syndrome happens when the small muscle that connects the base of your spine to your hip (piriformis muscle) presses on the nerve that runs down the back of your leg (sciatic nerve). The piriformis muscle helps your hip rotate and helps to bring your leg back and out. It also helps shift your weight while you are walking to keep you stable. The sciatic nerve runs under or through the piriformis. Damage to the piriformis muscle can cause spasms that put pressure on the nerve below. This causes pain and discomfort while sitting and moving. The pain may feel as if it begins in the buttock and spreads (radiates) down your hip and thigh. What are the causes? This condition is caused by pressure on the sciatic nerve from the piriformis muscle. The piriformis muscle can get irritated with overuse, especially if other hip muscles are weak and the piriformis has to do extra work. Piriformis syndrome can also occur after an injury, like a fall onto your buttocks. What increases the risk? This condition is more likely to develop in:  People who sit for long periods of time.  Cyclists.  People who have weak buttocks muscles (gluteal muscles).  What are the signs or symptoms? Pain, tingling, or numbness that starts in the buttock and runs down the back of your leg (sciatica) is the most common symptom of this condition. Your symptoms may:  Get worse the longer you sit.  Get worse when you walk, run, or go up on stairs.  How is this diagnosed? This condition is diagnosed based on your symptoms, medical history, and physical exam. During this exam, your health care provider may move  your leg into different positions to check for pain. He or she will also press on the muscles of your hip and buttock to see if that increases your symptoms. You may also have an X-ray or MRI. How is this treated? Treatment for this condition may include:  Stopping all activities that cause pain or make your condition worse.  Using heat or ice to relieve pain as  told by your health care provider.  Taking medicines to reduce pain and swelling.  Taking a muscle relaxer to release the piriformis muscle.  Doing range-of-motion and strengthening exercises (physical therapy) as told by your health care provider.  Massaging the affected area.  Getting an injection of an anti-inflammatory medicine or muscle relaxer to reduce inflammation and muscle tension.  In rare cases, you may need surgery to cut the muscle and release pressure on the nerve if other treatments do not work. Follow these instructions at home:  Take over-the-counter and prescription medicines only as told by your health care provider.  Do not sit for long periods. Get up and walk around every 20 minutes or as often as told by your health care provider.  If directed, apply heat to the affected area as often as told by your health care provider. Use the heat source that your health care provider recommends, such as a moist heat pack or a heating pad. ? Place a towel between your skin and the heat source. ? Leave the heat on for 20-30 minutes. ? Remove the heat if your skin turns bright red. This is especially important if you are unable to feel pain, heat, or cold. You may have a greater risk of getting burned.  If directed, apply ice to the injured area. ? Put ice in a plastic bag. ? Place a towel between your skin and the bag. ? Leave the ice on for 20 minutes, 2-3 times a day.  Do exercises as told by your health care provider.  Return to your normal activities as told by your health care provider. Ask your health care  provider what activities are safe for you.  Keep all follow-up visits as told by your health care provider. This is important. How is this prevented?  Do not sit for longer than 20 minutes at a time. When you sit, choose padded surfaces.  Warm up and stretch before being active.  Cool down and stretch after being active.  Give your body time to rest between periods of activity.  Make sure to use equipment that fits you.  Maintain physical fitness, including: ? Strength. ? Flexibility. Contact a health care provider if:  Your pain and stiffness continue or get worse.  Your leg or hip becomes weak.  You have changes in your bowel function or bladder function. This information is not intended to replace advice given to you by your health care provider. Make sure you discuss any questions you have with your health care provider. Document Released: 02/26/2005 Document Revised: 11/01/2015 Document Reviewed: 02/08/2015 Elsevier Interactive Patient Education  [employer].

## 2017-03-21 NOTE — Progress Notes (Signed)
Assessment and Plan:  ASSESSMENT AND PLAN::  Lower back pain x 18 months- has L lower back muscular/thigh pain with straight leg raise - no midline pain, worsening tingling. No numbness.  Prednisone long taper was prescribed,NSAIDs, RICE, and exercise given Will obtain lumbar XR, refer to PT, ortho/sports med referral initiated that he may cancel if improved with PT Go to the ER if you have any new weakness in your legs, have trouble controlling your urine or bowels, or have worsening pain.   Further disposition pending results of labs. Discussed med's effects and SE's.   Over 15 minutes of exam, counseling, chart review, and critical decision making was performed.   Future Appointments  Date Time Provider [place]  07/16/2017  9:00 AM Reginald Mutters, PA-C [id number] None    ------------------------------------------------------------------------------------------------------------------   HPI [name] is a 32 y.o. male who complains of an injury causing low back pain 18 month(s) ago. The pain is intermittent, dependent on activity levels - he reports by the end of the day when he has been walking extensively he cannot walk upright due  with bending or lifting, with radiation down the legs (on left posterior). Mechanism of injury: jumping in bouncy house. Symptoms have been intermittent since that time. Prior history of back problems: recurrent self limited episodes of low back pain in the past and previous herniated disc. There is no numbness in the legs. Patient denies fever, hematuria, incontinence, numbness, weakness and saddle anesthesia  Past medical history: hx of L5- S1 herniated disc - 2005; was managed by physical therapy and symptoms resolved.  Past Medical History:  Diagnosis Date  . Lumbar herniated disc 2005   L5-S1     No Known Allergies  No current outpatient medications on file prior to visit.   No current facility-administered medications on file  prior to visit.     ROS: all negative except above.   Physical Exam:  BP 126/82   Pulse 80   Temp 98.2 F (36.8 C)   Ht 5\' 10"  (1.778 m)   Wt 208 lb (94.3 kg)   SpO2 99%   BMI 29.84 kg/m   General Appearance: Well nourished, in no apparent distress. ENeck: Supple.  Respiratory: Respiratory effort normal, BS equal bilaterally without rales, rhonchi, wheezing or stridor.  Cardio: RRR with no MRGs. Brisk peripheral pulses without edema.  Musculoskeletal: Full ROM, 5/5 strength, normal gait. Pain with ROM lumbar spine, no spinouindicates origin of lumbar pain as  Skin: Warm, dry without rashes, lesions, ecchymosis.  Neuro: Cranial nerves intact. Normal muscle tone, no cerebellar symptoms. Sensation intact.  Psych: Awake and oriented X 3, normal affect, Insight and Judgment appropriate.     Reginald Ribas, NP 10:46 AM [name]

## 2017-03-22 ENCOUNTER — Ambulatory Visit (HOSPITAL_COMMUNITY)
Admission: RE | Admit: 2017-03-22 | Discharge: 2017-03-22 | Disposition: A | Discharge status: Home / Self Care | Payer: 59 | Source: Ambulatory Visit | Attending: Adult Health | Admitting: Adult Health

## 2017-03-22 ENCOUNTER — Encounter: Payer: Self-pay | Admitting: Adult Health

## 2017-03-22 ENCOUNTER — Ambulatory Visit: Payer: 59 | Admitting: Adult Health

## 2017-03-22 VITALS — BP 126/82 | HR 80 | Temp 98.2°F | Ht 70.0 in | Wt 208.0 lb

## 2017-03-22 DIAGNOSIS — M545 Low back pain, unspecified: Secondary | ICD-10-CM

## 2017-03-22 DIAGNOSIS — M79605 Pain in left leg: Secondary | ICD-10-CM

## 2017-03-22 MED ORDER — CYCLOBENZAPRINE HCL 10 MG PO TABS: 10.0000 mg | ORAL_TABLET | Freq: Every day | ORAL | 0 refills | Status: DC

## 2017-03-22 MED ORDER — PREDNISONE 20 MG PO TABS: ORAL_TABLET | ORAL | 0 refills | Status: DC

## 2017-03-22 NOTE — Patient Instructions (Signed)
Back Pain, Adult Many adults have back pain from time to time. Common causes of back pain include:  A strained muscle or ligament.  Wear and tear (degeneration) of the spinal disks.  Arthritis.  A hit to the back.  Back pain can be short-lived (acute) or last a long time (chronic). A physical exam, lab tests, and imaging studies may be done to find the cause of your pain. Follow these instructions at home: Managing pain and stiffness  Take over-the-counter and prescription medicines only as told by your health care provider.  If directed, apply heat to the affected area as often as told by your health care provider. Use the heat source that your health care provider recommends, such as a moist heat pack or a heating pad. ? Place a towel between your skin and the heat source. ? Leave the heat on for 20-30 minutes. ? Remove the heat if your skin turns bright red. This is especially important if you are unable to feel pain, heat, or cold. You have a greater risk of getting burned.  If directed, apply ice to the injured area: ? Put ice in a plastic bag. ? Place a towel between your skin and the bag. ? Leave the ice on for 20 minutes, 2-3 times a day for the first 2-3 days. Activity  Do not stay in bed. Resting more than 1-2 days can delay your recovery.  Take short walks on even surfaces as soon as you are able. Try to increase the length of time you walk each day.  Do not sit, drive, or stand in one place for more than 30 minutes at a time. Sitting or standing for long periods of time can put stress on your back.  Use proper lifting techniques. When you bend and lift, use positions that put less stress on your back: ? Bend your knees. ? Keep the load close to your body. ? Avoid twisting.  Exercise regularly as told by your health care provider. Exercising will help your back heal faster. This also helps prevent back injuries by keeping muscles strong and flexible.  Your health  care provider may recommend that you see a physical therapist. This person can help you come up with a safe exercise program. Do any exercises as told by your physical therapist. Lifestyle  Maintain a healthy weight. Extra weight puts stress on your back and makes it difficult to have good posture.  Avoid activities or situations that make you feel anxious or stressed. Learn ways to manage anxiety and stress. One way to manage stress is through exercise. Stress and anxiety increase muscle tension and can make back pain worse. General instructions  Sleep on a firm mattress in a comfortable position. Try lying on your side with your knees slightly bent. If you lie on your back, put a pillow under your knees.  Follow your treatment plan as told by your health care provider. This may include: ? Cognitive or behavioral therapy. ? Acupuncture or massage therapy. ? Meditation or yoga. Contact a health care provider if:  You have pain that is not relieved with rest or medicine.  You have increasing pain going down into your legs or buttocks.  Your pain does not improve in 2 weeks.  You have pain at night.  You lose weight.  You have a fever or chills. Get help right away if:  You develop new bowel or bladder control problems.  You have unusual weakness or numbness in your arms   or legs.  You develop nausea or vomiting.  You develop abdominal pain.  You feel faint. Summary  Many adults have back pain from time to time. A physical exam, lab tests, and imaging studies may be done to find the cause of your pain.  Use proper lifting techniques. When you bend and lift, use positions that put less stress on your back.  Take over-the-counter and prescription medicines and apply heat or ice as directed by your health care provider. This information is not intended to replace advice given to you by your health care provider. Make sure you discuss any questions you have with your health care  provider. Document Released: 02/26/2005 Document Revised: 04/02/2016 Document Reviewed: 04/02/2016 Elsevier Interactive Patient Education  2018 [place] Ask your health care provider which exercises are safe for you. Do exercises exactly as told by your health care provider and adjust them as directed. It is normal to feel mild stretching, pulling, tightness, or discomfort as you do these exercises, but you should stop right away if you feel sudden pain or your pain gets worse. Do not begin these exercises until told by your health care provider. Stretching and range of motion exercises These exercises warm up your muscles and joints and improve the movement and flexibility of your back. These exercises also help to relieve pain, numbness, and tingling. Exercise A: Lumbar rotation  Lie on your back on a firm surface and bend your knees. Straighten your arms out to your sides so each arm forms an "L" shape with a side of your body (a 90 degree angle). Slowly move both of your knees to one side of your body until you feel a stretch in your lower back. Try not to let your shoulders move off of the floor. Hold for __________ seconds. Tense your abdominal muscles and slowly move your knees back to the starting position. Repeat this exercise on the other side of your body. Repeat __________ times. Complete this exercise __________ times a day. Exercise B: Prone extension on elbows  Lie on your abdomen on a firm surface. Prop yourself up on your elbows. Use your arms to help lift your chest up until you feel a gentle stretch in your abdomen and your lower back. This will place some of your body weight on your elbows. If this is uncomfortable, try stacking pillows under your chest. Your hips should stay down, against the surface that you are lying on. Keep your hip and back muscles relaxed. Hold for __________ seconds. Slowly relax your upper body and return to the  starting position. Repeat __________ times. Complete this exercise __________ times a day. Strengthening exercises These exercises build strength and endurance in your back. Endurance is the ability to use your muscles for a long time, even after they get tired. Exercise C: Pelvic tilt Lie on your back on a firm surface. Bend your knees and keep your feet flat. Tense your abdominal muscles. Tip your pelvis up toward the ceiling and flatten your lower back into the floor. To help with this exercise, you may place a small towel under your lower back and try to push your back into the towel. Hold for __________ seconds. Let your muscles relax completely before you repeat this exercise. Repeat __________ times. Complete this exercise __________ times a day. Exercise D: Alternating arm and leg raises  Get on your hands and knees on a firm surface. If you are on a hard floor,  you may want to use padding to cushion your knees, such as an exercise mat. Line up your arms and legs. Your hands should be below your shoulders, and your knees should be below your hips. Lift your left leg behind you. At the same time, raise your right arm and straighten it in front of you. Do not lift your leg higher than your hip. Do not lift your arm higher than your shoulder. Keep your abdominal and back muscles tight. Keep your hips facing the ground. Do not arch your back. Keep your balance carefully, and do not hold your breath. Hold for __________ seconds. Slowly return to the starting position and repeat with your right leg and your left arm. Repeat __________ times. Complete this exercise __________ times a day. Exercise E: Abdominal set with straight leg raise  Lie on your back on a firm surface. Bend one of your knees and keep your other leg straight. Tense your abdominal muscles and lift your straight leg up, 4-6 inches (10-15 cm) off the ground. Keep your abdominal muscles tight and hold for __________  seconds. Do not hold your breath. Do not arch your back. Keep it flat against the ground. Keep your abdominal muscles tense as you slowly lower your leg back to the starting position. Repeat with your other leg. Repeat __________ times. Complete this exercise __________ times a day. Posture and body mechanics  Body mechanics refers to the movements and positions of your body while you do your daily activities. Posture is part of body mechanics. Good posture and healthy body mechanics can help to relieve stress in your body's tissues and joints. Good posture means that your spine is in its natural S-curve position (your spine is neutral), your shoulders are pulled back slightly, and your head is not tipped forward. The following are general guidelines for applying improved posture and body mechanics to your everyday activities. Standing  When standing, keep your spine neutral and your feet about hip-width apart. Keep a slight bend in your knees. Your ears, shoulders, and hips should line up. When you do a task in which you stand in one place for a long time, place one foot up on a stable object that is 2-4 inches (5-10 cm) high, such as a footstool. This helps keep your spine neutral. Sitting  When sitting, keep your spine neutral and keep your feet flat on the floor. Use a footrest, if necessary, and keep your thighs parallel to the floor. Avoid rounding your shoulders, and avoid tilting your head forward. When working at a desk or a computer, keep your desk at a height where your hands are slightly lower than your elbows. Slide your chair under your desk so you are close enough to maintain good posture. When working at a computer, place your monitor at a height where you are looking straight ahead and you do not have to tilt your head forward or downward to look at the screen. Resting  When lying down and resting, avoid positions that are most painful for you. If you have pain with activities  such as sitting, bending, stooping, or squatting (flexion-based activities), lie in a position in which your body does not bend very much. For example, avoid curling up on your side with your arms and knees near your chest (fetal position). If you have pain with activities such as standing for a long time or reaching with your arms (extension-based activities), lie with your spine in a neutral position and bend your  knees slightly. Try the following positions: Lying on your side with a pillow between your knees. Lying on your back with a pillow under your knees. Lifting  When lifting objects, keep your feet at least shoulder-width apart and tighten your abdominal muscles. Bend your knees and hips and keep your spine neutral. It is important to lift using the strength of your legs, not your back. Do not lock your knees straight out. Always ask for help to lift heavy or awkward objects. This information is not intended to replace advice given to you by your health care provider. Make sure you discuss any questions you have with your health care provider. Document Released: 02/26/2005 Document Revised: 11/03/2015 Document Reviewed: 12/08/2014 Elsevier Interactive Patient Education  [employer].

## 2017-05-21 ENCOUNTER — Encounter: Payer: Self-pay | Admitting: Internal Medicine

## 2017-05-24 ENCOUNTER — Encounter (HOSPITAL_COMMUNITY): Payer: Self-pay | Admitting: Family Medicine

## 2017-05-24 ENCOUNTER — Other Ambulatory Visit: Payer: Self-pay

## 2017-05-24 ENCOUNTER — Ambulatory Visit (HOSPITAL_COMMUNITY)
Admission: EM | Admit: 2017-05-24 | Discharge: 2017-05-24 | Disposition: A | Discharge status: Home / Self Care | Payer: 59 | Attending: Family Medicine | Admitting: Family Medicine

## 2017-05-24 DIAGNOSIS — M545 Low back pain, unspecified: Secondary | ICD-10-CM

## 2017-05-24 DIAGNOSIS — M519 Unspecified thoracic, thoracolumbar and lumbosacral intervertebral disc disorder: Secondary | ICD-10-CM

## 2017-05-24 DIAGNOSIS — M5431 Sciatica, right side: Secondary | ICD-10-CM

## 2017-05-24 DIAGNOSIS — M79605 Pain in left leg: Secondary | ICD-10-CM

## 2017-05-24 MED ORDER — HYDROCODONE-ACETAMINOPHEN 5-325 MG PO TABS: 1.0000 | ORAL_TABLET | Freq: Four times a day (QID) | ORAL | 0 refills | Status: DC | PRN

## 2017-05-24 NOTE — ED Provider Notes (Signed)
[place]   [id number] 05/24/17 Arrival Time: 1008   SUBJECTIVE:  [name] is a 32 y.o. male who presents to the urgent care with complaint of muscles spasms in right thigh and burning associated with right low back pain.  Patient has had left sciatica for the last 6-8 months. He has been on steroids intermittently since.  Patient works in [employer] he was working in the yard and felt a pop in his right hip and then developed right sciatica.  Patient has had a history of ruptured disc many years ago.  No fever or loss of bladder control; some numbness extending down into the right calf.   Past Medical History:  Diagnosis Date  . Lumbar herniated disc 2005   L5-S1   No family history on file. Social History   Socioeconomic History  . Marital status: Married    Spouse name: Not on file  . Number of children: Not on file  . Years of education: Not on file  . Highest education level: Not on file  Social Needs  . Financial resource strain: Not on file  . Food insecurity - worry: Not on file  . Food insecurity - inability: Not on file  . Transportation needs - medical: Not on file  . Transportation needs - non-medical: Not on file  Occupational History  . Not on file  Tobacco Use  . Smoking status: Current Every Day Smoker  . Smokeless tobacco: Never Used  Substance and Sexual Activity  . Alcohol use: Not on file  . Drug use: Not on file  . Sexual activity: Not on file  Other Topics Concern  . Not on file  Social History Narrative  . Not on file   No outpatient medications have been marked as taking for the 05/24/17 encounter [facility] Encounter).   No Known Allergies    ROS: As per HPI, remainder of ROS negative.   OBJECTIVE:   Vitals:   05/24/17 1035  BP: (!) 136/111  Pulse: 75  Temp: 98.3 F (36.8 C)  TempSrc: Oral  SpO2: 99%     General appearance: alert; patient in obvious pain, moving slowly. Eyes: PERRL; EOMI;  conjunctiva normal HENT: normocephalic; atraumatic; oral mucosa normal Neck: supple Abdomen: soft, non-tender; bowel sounds normal; no masses or organomegaly; no guarding or rebound tenderness Back: no CVA tenderness; tender with palpation over L5 Extremities: no cyanosis or edema; symmetrical with no gross deformities; normal right hip range of motion; bilateral straight leg raising positive at 30 each side; no muscle wasting. Skin: warm and dry Neurologic: normal gait; grossly normal Psychological: alert and cooperative; normal mood and affect      Labs:  Results for orders placed or performed in visit on 04/04/15  CBC with Differential/Platelet  Result Value Ref Range   WBC 6.2 4.0 - 10.5 K/uL   RBC 5.38 4.22 - 5.81 MIL/uL   Hemoglobin 17.2 (H) 13.0 - 17.0 g/dL   HCT 48.1 39.0 - 52.0 %   MCV 89.4 78.0 - 100.0 fL   MCH 32.0 26.0 - 34.0 pg   MCHC 35.8 30.0 - 36.0 g/dL   RDW 13.4 11.5 - 15.5 %   Platelets 250 150 - 400 K/uL   MPV 9.3 8.6 - 12.4 fL   Neutrophils Relative % 61 43 - 77 %   Neutro Abs 3.8 1.7 - 7.7 K/uL   Lymphocytes Relative 29 12 - 46 %   Lymphs Abs 1.8 0.7 - 4.0 K/uL  Monocytes Relative 6 3 - 12 %   Monocytes Absolute 0.4 0.1 - 1.0 K/uL   Eosinophils Relative 3 0 - 5 %   Eosinophils Absolute 0.2 0.0 - 0.7 K/uL   Basophils Relative 1 0 - 1 %   Basophils Absolute 0.1 0.0 - 0.1 K/uL   Smear Review Criteria for review not met   BASIC METABOLIC PANEL WITH GFR  Result Value Ref Range   Sodium 140 135 - 146 mmol/L   Potassium 4.6 3.5 - 5.3 mmol/L   Chloride 103 98 - 110 mmol/L   CO2 24 20 - 31 mmol/L   Glucose, Bld 86 65 - 99 mg/dL   BUN 12 7 - 25 mg/dL   Creat 1.06 0.60 - 1.35 mg/dL   Calcium 9.7 8.6 - 10.3 mg/dL   GFR, Est African American >89 >=60 mL/min   GFR, Est Non African American >89 >=60 mL/min  Hepatic function panel  Result Value Ref Range   Total Bilirubin 1.0 0.2 - 1.2 mg/dL   Bilirubin, Direct 0.2 <=0.2 mg/dL   Indirect Bilirubin 0.8  0.2 - 1.2 mg/dL   Alkaline Phosphatase 42 40 - 115 U/L   AST 17 10 - 40 U/L   ALT 21 9 - 46 U/L   Total Protein 7.1 6.1 - 8.1 g/dL   Albumin 4.5 3.6 - 5.1 g/dL  Magnesium  Result Value Ref Range   Magnesium 2.0 1.5 - 2.5 mg/dL  Lipid panel  Result Value Ref Range   Cholesterol 164 125 - 200 mg/dL   Triglycerides 148 <150 mg/dL   HDL 31 (L) >=40 mg/dL   Total CHOL/HDL Ratio 5.3 (H) <=5.0 Ratio   VLDL 30 <30 mg/dL   LDL Cholesterol 103 <130 mg/dL  Hemoglobin A1c  Result Value Ref Range   Hgb A1c MFr Bld 5.3 <5.7 %   Mean Plasma Glucose 105 <117 mg/dL  Insulin, random  Result Value Ref Range   Insulin 16.2 2.0 - 19.6 uIU/mL  Iron and TIBC  Result Value Ref Range   Iron 150 50 - 180 ug/dL   UIBC 175 125 - 400 ug/dL   TIBC 325 250 - 425 ug/dL   %SAT 46 15 - 60 %  Vitamin B12  Result Value Ref Range   Vitamin B-12 512 211 - 911 pg/mL  Urinalysis, Routine w reflex microscopic (not at [facility])  Result Value Ref Range   Color, Urine YELLOW YELLOW   APPearance CLEAR CLEAR   Specific Gravity, Urine 1.018 1.001 - 1.035   pH 5.5 5.0 - 8.0   Glucose, UA NEGATIVE NEGATIVE   Bilirubin Urine NEGATIVE NEGATIVE   Ketones, ur NEGATIVE NEGATIVE   Hgb urine dipstick NEGATIVE NEGATIVE   Protein, ur NEGATIVE NEGATIVE   Nitrite NEGATIVE NEGATIVE   Leukocytes, UA NEGATIVE NEGATIVE  Microalbumin / creatinine urine ratio  Result Value Ref Range   Creatinine, Urine 191 20 - 370 mg/dL   Microalb, Ur 0.2 Not estab mg/dL   Microalb Creat Ratio 1 <30 mcg/mg creat  VITAMIN D 25 Hydroxy (Vit-D Deficiency, Fractures)  Result Value Ref Range   Vit D, 25-Hydroxy 33 30 - 100 ng/mL  TSH  Result Value Ref Range   TSH 1.179 0.350 - 4.500 uIU/mL    Labs Reviewed - No data to display  No results found.     ASSESSMENT & PLAN:  1. Lumbar disc disease   2. Sciatica, right side   3. Low back pain radiating to left lower extremity  Meds ordered this encounter  Medications  .  HYDROcodone-acetaminophen (NORCO) 5-325 MG tablet    Sig: Take 1 tablet by mouth every 6 (six) hours as needed for moderate pain.    Dispense:  12 tablet    Refill:  0   Call your primary care doctor to get the MRI scheduled.  Reviewed expectations re: course of current medical issues. Questions answered. Outlined signs and symptoms indicating need for more acute intervention. Patient verbalized understanding. After Visit Summary given.       Robyn Haber, MD 05/24/17 [id number]

## 2017-05-24 NOTE — ED Triage Notes (Signed)
States having muscles spasms in right thigh and burning

## 2017-05-24 NOTE — ED Triage Notes (Signed)
C/o right hip and lower back pain onset yesterday, no known injury

## 2017-05-24 NOTE — Discharge Instructions (Signed)
You need to call your primary care doctor to get the Lumbar MRI ordered.

## 2017-05-26 NOTE — Progress Notes (Signed)
Assessment and Plan:  Cj was seen today for back injury.  Diagnoses and all orders for this visit:  Lumbar pain with radiation down right leg Ortho vs neuro pending MRI results - will schedule urgently Will repeat steroid taper as this was previously very efficacious, use norco only for severe breakthrough pain. Discussed we will not be able to prescribe any further opioid pain medications - risks and SE discussed.  -     MR Lumbar Spine Wo Contrast; Future -     HYDROcodone-acetaminophen (NORCO) 5-325 MG tablet; Take 1 tablet by mouth every 6 (six) hours as needed for moderate pain. -     predniSONE (DELTASONE) 20 MG tablet; 3 tablets daily with food for 3 days, 2 tabs daily for 3 days, 1 tab a day for 5 days.  Further disposition pending results of labs. Discussed med's effects and SE's.   Over 15 minutes of exam, counseling, chart review, and critical decision making was performed.   Future Appointments  Date Time Provider [place]  07/16/2017  9:00 AM Vicie Mutters, PA-C [id number] None    ------------------------------------------------------------------------------------------------------------------   HPI BP 106/80   Pulse 87   Temp (!) 97.5 F (36.4 C)   Ht 5\' 10"  (1.778 m)   Wt 205 lb (93 kg)   SpO2 98%   BMI 29.41 kg/m   32 y.o.male presents for ER follow up for acute worsening of lower back pain ongoing since 11/2015 - he reports pain became acutely worse 4 days ago after hearing a "pop" while he was leaf blowing his yard; presented to ED and was discharged with norco for severe pain and recommendations for follow up by outpatient MRI. He presents today c/o lumbar pain 9/10 when not taking pain medication, constant with shooting pain to R hip area, with altered sensation of left lateral lower leg. Worse with extended walking/standing, any position changes. Denies fever/chills, loss of bladder/bowel control or weakness. No foot drop noted today.   He was  evaluated for lumbar pain at our office on 1/11 and was prescribed steroid taper, PT and orthopedic referral was initiated - he reports he was unable to follow through with this as appointment was scheduled in [place], did not bother following up with [place] as "it wasn't terrible at that point."   He has hx of herniated lumbar disc at L5-S1 which resolved with steroid taper and PT.  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 Normal alignment. No fracture. SI joints are symmetric and unremarkable. Disc spaces maintained.   Past Medical History:  Diagnosis Date  . Lumbar herniated disc 2005   L5-S1     No Known Allergies  Current Outpatient Medications on File Prior to Visit  Medication Sig  . HYDROcodone-acetaminophen (NORCO) 5-325 MG tablet Take 1 tablet by mouth every 6 (six) hours as needed for moderate pain.   No current facility-administered medications on file prior to visit.     ROS: all negative except above.   Physical Exam:  BP 106/80   Pulse 87   Temp (!) 97.5 F (36.4 C)   Ht 5\' 10"  (1.778 m)   Wt 205 lb (93 kg)   SpO2 98%   BMI 29.41 kg/m   General Appearance: Well nourished, in no apparent distress. Neck: Supple.  Respiratory: Respiratory effort normal, BS equal bilaterally without rales, rhonchi, wheezing or stridor.  Cardio: RRR with no MRGs. Brisk peripheral pulses without edema.  Abdomen: Soft, + BS.  Non tender, no guarding,  rebound, hernias, masses. Lymphatics: Non tender without lymphadenopathy.  Musculoskeletal: Lumbar ROM significantly limited by pain, bilateral hip ROM unremarkable without palpable abnormality, clicking, popping, 5/5 strength of lower extremities, antalgic gait. Unable to complete straight leg raises due to lumbar pain.  Skin: Warm, dry without rashes, lesions, ecchymosis.  Neuro:Normal muscle tone, some decreased sensation of left lateral lower leg and outer foot, sole.  Psych: Awake and oriented X 3, normal affect, Insight and  Judgment appropriate.     Izora Ribas, NP 10:13 AM [facility]

## 2017-05-27 ENCOUNTER — Ambulatory Visit: Payer: 59 | Admitting: Adult Health

## 2017-05-27 ENCOUNTER — Encounter: Payer: Self-pay | Admitting: Adult Health

## 2017-05-27 VITALS — BP 106/80 | HR 87 | Temp 97.5°F | Ht 70.0 in | Wt 205.0 lb

## 2017-05-27 DIAGNOSIS — M545 Low back pain, unspecified: Secondary | ICD-10-CM

## 2017-05-27 MED ORDER — PREDNISONE 20 MG PO TABS: ORAL_TABLET | ORAL | 0 refills | Status: DC

## 2017-05-27 MED ORDER — HYDROCODONE-ACETAMINOPHEN 5-325 MG PO TABS: 1.0000 | ORAL_TABLET | Freq: Four times a day (QID) | ORAL | 0 refills | Status: DC | PRN

## 2017-05-27 NOTE — Patient Instructions (Signed)

## 2017-06-03 ENCOUNTER — Other Ambulatory Visit: Payer: Self-pay | Admitting: Adult Health

## 2017-06-03 ENCOUNTER — Ambulatory Visit
Admission: RE | Admit: 2017-06-03 | Discharge: 2017-06-03 | Disposition: A | Discharge status: Home / Self Care | Payer: 59 | Source: Ambulatory Visit | Attending: Adult Health | Admitting: Adult Health

## 2017-06-03 DIAGNOSIS — M5136 Other intervertebral disc degeneration, lumbar region: Secondary | ICD-10-CM

## 2017-06-03 DIAGNOSIS — M545 Low back pain, unspecified: Secondary | ICD-10-CM

## 2017-06-03 DIAGNOSIS — M5416 Radiculopathy, lumbar region: Secondary | ICD-10-CM

## 2017-06-03 DIAGNOSIS — M5126 Other intervertebral disc displacement, lumbar region: Secondary | ICD-10-CM

## 2017-06-06 ENCOUNTER — Telehealth: Payer: Self-pay

## 2017-06-06 ENCOUNTER — Other Ambulatory Visit: Payer: Self-pay | Admitting: Adult Health

## 2017-06-06 DIAGNOSIS — M545 Low back pain, unspecified: Secondary | ICD-10-CM

## 2017-06-06 MED ORDER — PREDNISONE 20 MG PO TABS: ORAL_TABLET | ORAL | 0 refills | Status: AC

## 2017-06-06 MED ORDER — TRAMADOL HCL 50 MG PO TABS: ORAL_TABLET | ORAL | 0 refills | Status: DC

## 2017-06-06 NOTE — Telephone Encounter (Signed)
Patient aware.

## 2017-06-06 NOTE — Telephone Encounter (Signed)
Waiting on referral to surgeon, but has ran out of pain med asking for a refill. Not sure if he should do another round of prednisone. Whatever you think just in pain. Please advise

## 2017-06-12 ENCOUNTER — Encounter: Payer: Self-pay | Admitting: Adult Health

## 2017-07-15 NOTE — Progress Notes (Deleted)
Complete Physical  Assessment and Plan:  Discussed med's effects and SE's. Screening labs and tests as requested with regular follow-up as recommended. Over 40 minutes of exam, counseling, chart review and critical decision making was performed  HPI Patient presents for a complete physical.   His blood pressure {HAS HAS NOT:18834} been controlled at home, today their BP is   He {DOES_DOES EXB:28413} workout. He denies chest pain, shortness of breath, dizziness.   BMI is There is no height or weight on file to calculate BMI., he is working on diet and exercise. Wt Readings from Last 3 Encounters:  05/27/17 205 lb (93 kg)  03/22/17 208 lb (94.3 kg)  12/03/16 207 lb (93.9 kg)    He {ACTION; IS/IS NOT:21021397} on cholesterol medication and denies myalgias. His cholesterol {ACTION; IS/IS NOT:21021397} at goal. The cholesterol last visit was:   Lab Results  Component Value Date   CHOL 164 04/04/2015   HDL 31 (L) 04/04/2015   LDLCALC 103 04/04/2015   TRIG 148 04/04/2015   CHOLHDL 5.3 (H) 04/04/2015   Last A1C in the office was:  Lab Results  Component Value Date   HGBA1C 5.3 04/04/2015   Patient is on Vitamin D supplement.   Lab Results  Component Value Date   VD25OH 33 04/04/2015     He has lower back pain following with ortho.   Current Medications:  Current Outpatient Medications on File Prior to Visit  Medication Sig Dispense Refill  . traMADol (ULTRAM) 50 MG tablet Take 1 tab up to every 6 hours as needed for severe breakthrough pain. 20 tablet 0   No current facility-administered medications on file prior to visit.    Allergies:  No Known Allergies Health Maintenance:   There is no immunization history on file for this patient.  Tetanus: Pneumovax: Prevnar 13: Flu vaccine: Zostavax:  DEXA: Colonoscopy: EGD: Eye Exam: Dentist:  Patient Care Team: Unk Pinto, MD as PCP - General (Internal Medicine)  Medical History:  has Tobacco abuse on their  problem list. Surgical History:  He  has no past surgical history on file. Family History:  His family history is not on file. Social History:   reports that he has been smoking.  He has never used smokeless tobacco. His alcohol and drug histories are not on file. Review of Systems:  ROS  Physical Exam: Estimated body mass index is 29.41 kg/m as calculated from the following:   Height as of 05/27/17: 5\' 10"  (1.778 m).   Weight as of 05/27/17: 205 lb (93 kg). There were no vitals taken for this visit. General Appearance: Well nourished, in no apparent distress.  Eyes: PERRLA, EOMs, conjunctiva no swelling or erythema, normal fundi and vessels.  Sinuses: No Frontal/maxillary tenderness  ENT/Mouth: Ext aud canals clear, normal light reflex with TMs without erythema, bulging. Good dentition. No erythema, swelling, or exudate on post pharynx. Tonsils not swollen or erythematous. Hearing normal.  Neck: Supple, thyroid normal. No bruits  Respiratory: Respiratory effort normal, BS equal bilaterally without rales, rhonchi, wheezing or stridor.  Cardio: RRR without murmurs, rubs or gallops. Brisk peripheral pulses without edema.  Chest: symmetric, with normal excursions and percussion.  Abdomen: Soft, nontender, no guarding, rebound, hernias, masses, or organomegaly.  Lymphatics: Non tender without lymphadenopathy.  Genitourinary:  Musculoskeletal: Full ROM all peripheral extremities,5/5 strength, and normal gait.  Skin: Warm, dry without rashes, lesions, ecchymosis. Neuro: Cranial nerves intact, reflexes equal bilaterally. Normal muscle tone, no cerebellar symptoms. Sensation intact.  Psych: Awake  and oriented X 3, normal affect, Insight and Judgment appropriate.   EKG: WNL no changes. AORTA SCAN: WNL  Vicie Mutters 7:46 AM [facility]

## 2017-07-16 ENCOUNTER — Encounter: Payer: Self-pay | Admitting: Physician Assistant

## 2018-07-03 ENCOUNTER — Other Ambulatory Visit: Payer: Self-pay | Admitting: Adult Health

## 2018-07-03 ENCOUNTER — Telehealth: Payer: Self-pay

## 2018-07-03 MED ORDER — VARENICLINE TARTRATE 1 MG PO TABS: 1.0000 mg | ORAL_TABLET | Freq: Two times a day (BID) | ORAL | 2 refills | Status: DC

## 2018-07-03 NOTE — Telephone Encounter (Signed)
Patient would like to start Chantix again. Please send to pharmacy.

## 2018-07-17 ENCOUNTER — Encounter: Payer: Self-pay | Admitting: Physician Assistant

## 2018-09-09 ENCOUNTER — Other Ambulatory Visit: Payer: Self-pay

## 2018-09-09 ENCOUNTER — Emergency Department
Admission: EM | Admit: 2018-09-09 | Discharge: 2018-09-10 | Disposition: A | Discharge status: Other Acute Inpatient Hospital | Payer: 59 | Attending: Emergency Medicine | Admitting: Emergency Medicine

## 2018-09-09 DIAGNOSIS — R4689 Other symptoms and signs involving appearance and behavior: Secondary | ICD-10-CM | POA: Diagnosis present

## 2018-09-09 DIAGNOSIS — F10929 Alcohol use, unspecified with intoxication, unspecified: Secondary | ICD-10-CM | POA: Insufficient documentation

## 2018-09-09 DIAGNOSIS — Z79899 Other long term (current) drug therapy: Secondary | ICD-10-CM | POA: Diagnosis not present

## 2018-09-09 DIAGNOSIS — Z046 Encounter for general psychiatric examination, requested by authority: Secondary | ICD-10-CM | POA: Insufficient documentation

## 2018-09-09 DIAGNOSIS — Z20828 Contact with and (suspected) exposure to other viral communicable diseases: Secondary | ICD-10-CM | POA: Diagnosis not present

## 2018-09-09 DIAGNOSIS — R45851 Suicidal ideations: Secondary | ICD-10-CM | POA: Diagnosis not present

## 2018-09-09 DIAGNOSIS — F17200 Nicotine dependence, unspecified, uncomplicated: Secondary | ICD-10-CM | POA: Diagnosis not present

## 2018-09-09 LAB — SALICYLATE LEVEL: Salicylate Lvl: <7 mg/dL (ref 2.8–30.0)

## 2018-09-09 LAB — URINE DRUG SCREEN, QUALITATIVE (ARMC ONLY)
Amphetamines, Ur Screen: NOT DETECTED
Barbiturates, Ur Screen: NOT DETECTED
Benzodiazepine, Ur Scrn: NOT DETECTED
Cannabinoid 50 Ng, Ur ~~LOC~~: NOT DETECTED
Cocaine Metabolite,Ur ~~LOC~~: NOT DETECTED
MDMA (Ecstasy)Ur Screen: NOT DETECTED
Methadone Scn, Ur: NOT DETECTED
Opiate, Ur Screen: NOT DETECTED
Phencyclidine (PCP) Ur S: NOT DETECTED
Tricyclic, Ur Screen: NOT DETECTED

## 2018-09-09 LAB — COMPREHENSIVE METABOLIC PANEL
ALT: 24 U/L (ref 0–44)
AST: 21 U/L (ref 15–41)
Albumin: 4.9 g/dL (ref 3.5–5.0)
Alkaline Phosphatase: 47 U/L (ref 38–126)
Anion gap: 12 (ref 5–15)
BUN: 8 mg/dL (ref 6–20)
CO2: 23 mmol/L (ref 22–32)
Calcium: 9.2 mg/dL (ref 8.9–10.3)
Chloride: 104 mmol/L (ref 98–111)
Creatinine, Ser: 0.93 mg/dL (ref 0.61–1.24)
GFR calc Af Amer: >60 mL/min (ref >60)
GFR calc non Af Amer: >60 mL/min (ref >60)
Glucose, Bld: 99 mg/dL (ref 70–99)
Potassium: 3.2 mmol/L — ABNORMAL LOW (ref 3.5–5.1)
Sodium: 139 mmol/L (ref 135–145)
Total Bilirubin: 1 mg/dL (ref 0.3–1.2)
Total Protein: 8.1 g/dL (ref 6.5–8.1)

## 2018-09-09 LAB — CBC
HCT: 48.4 % (ref 39.0–52.0)
Hemoglobin: 17.8 g/dL — ABNORMAL HIGH (ref 13.0–17.0)
MCH: 31.9 pg (ref 26.0–34.0)
MCHC: 36.8 g/dL — ABNORMAL HIGH (ref 30.0–36.0)
MCV: 86.7 fL (ref 80.0–100.0)
Platelets: 252 10*3/uL (ref 150–400)
RBC: 5.58 MIL/uL (ref 4.22–5.81)
RDW: 11.9 % (ref 11.5–15.5)
WBC: 9.7 10*3/uL (ref 4.0–10.5)
nRBC: 0 % (ref 0.0–0.2)

## 2018-09-09 LAB — ETHANOL: Alcohol, Ethyl (B): 97 mg/dL — ABNORMAL HIGH (ref <10)

## 2018-09-09 LAB — ACETAMINOPHEN LEVEL: Acetaminophen (Tylenol), Serum: <10 ug/mL — ABNORMAL LOW (ref 10–30)

## 2018-09-09 NOTE — ED Provider Notes (Signed)
[facility] Emergency Department Provider Note  ____________________________________________   I have reviewed the triage vital signs and the nursing notes.   HISTORY  Chief Complaint Suicidal   History limited by: Not Limited   HPI [name] is a 33 y.o. male who presents to the emergency department today under IVC because of concerns for suicidal ideation.  Patient states that he had a lapse of judgment today.  He states that he told his wife that he wanted to legally separated but then heard from his girlfriend that she also wanted to break it off with him.  He states that in the moment he did think of hurting himself.  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  The patient denies any history of depression.  He denies trying to harm himself in the past.  He states that at this time he is mostly concerned about what will happen next.  Records reviewed. Per medical record review patient has a history of tobacco abuse.  Past Medical History:  Diagnosis Date  . Lumbar herniated disc 2005   L5-S1    Patient Active Problem List   Diagnosis Date Noted  . Tobacco abuse 04/04/2015    No past surgical history on file.  Prior to Admission medications   Medication Sig Start Date End Date Taking? Authorizing Provider  traMADol (ULTRAM) 50 MG tablet Take 1 tab up to every 6 hours as needed for severe breakthrough pain. 06/06/17   Liane Comber, NP  varenicline (CHANTIX CONTINUING MONTH PAK) 1 MG tablet Take 1 tablet (1 mg total) by mouth 2 (two) times daily. 07/03/18   Liane Comber, NP    Allergies Patient has no known allergies.  No family history on file.  Social History Social History   Tobacco Use  . Smoking status: Current Every Day Smoker  . Smokeless tobacco: Never Used  Substance Use Topics  . Alcohol use: Not on file  . Drug use: Not on file    Review of Systems Constitutional: No fever/chills Eyes: No visual  changes. ENT: No sore throat. Cardiovascular: Denies chest pain. Respiratory: Denies shortness of breath. Gastrointestinal: No abdominal pain.  No nausea, no vomiting.  No diarrhea.   Genitourinary: Negative for dysuria. Musculoskeletal: Negative for back pain. Skin: Negative for rash. Neurological: Negative for headaches, focal weakness or numbness.  ____________________________________________   PHYSICAL EXAM:  VITAL SIGNS: ED Triage Vitals [09/09/18 2055]  Enc Vitals Group     BP (!) 155/103     Pulse Rate (!) 108     Resp 20     Temp 99.3 F (37.4 C)     Temp Source Oral     SpO2 96 %     Weight      Height      Head Circumference      Peak Flow      Pain Score 0   Constitutional: Alert and oriented.  Eyes: Conjunctivae are normal.  ENT      Head: Normocephalic and atraumatic.      Nose: No congestion/rhinnorhea.      Mouth/Throat: Mucous membranes are moist.      Neck: No stridor. Hematological/Lymphatic/Immunilogical: No cervical lymphadenopathy. Cardiovascular: Normal rate, regular rhythm.  No murmurs, rubs, or gallops. Respiratory: Normal respiratory effort without tachypnea nor retractions. Breath sounds are clear and equal bilaterally. No wheezes/rales/rhonchi. Gastrointestinal: Soft and non tender. No rebound. No guarding.  Genitourinary: Deferred Musculoskeletal: Normal range of motion in  all extremities. No lower extremity edema. Neurologic:  Normal speech and language. No gross focal neurologic deficits are appreciated.  Skin:  Skin is warm, dry and intact. No rash noted. Psychiatric: Depressed  ____________________________________________    LABS (pertinent positives/negatives)  Ethanol 97 Tylenol, salicylate level below threshold UDS negative CMP wnl except k 3.2 CBC wbc 9.7, hgb 17.8, plt 252 ____________________________________________   EKG  None  ____________________________________________     RADIOLOGY  None  ____________________________________________   PROCEDURES  Procedures  ____________________________________________   INITIAL IMPRESSION / ASSESSMENT AND PLAN / ED COURSE  Pertinent labs & imaging results that were available during my care of the patient were reviewed by me and considered in my medical decision making (see chart for details).   Patient presented to the emergency department today under IVC because of concerns for suicidal ideation.  On exam patient does appear somewhat depressed.  Will continue IVC and have psychiatry evaluate.    ____________________________________________   FINAL CLINICAL IMPRESSION(S) / ED DIAGNOSES  Final diagnoses:  Suicidal ideation     Note: This dictation was prepared with Dragon dictation. Any transcriptional errors that result from this process are unintentional     Nance Pear, MD 09/09/18 2201

## 2018-09-09 NOTE — ED Notes (Signed)
Patient talking to psychiatry

## 2018-09-09 NOTE — ED Notes (Signed)
Patient assigned to appropriate care area   Introduced self to pt  Patient oriented to unit/care area: Informed that, for their safety, care areas are designed for safety and visiting and phone hours explained to patient. Patient verbalizes understanding, and verbal contract for safety obtained  Environment secured  
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
## 2018-09-09 NOTE — ED Notes (Signed)
Patient dressed out with this RN and Thedore Mins, ED tech present.

## 2018-09-09 NOTE — ED Notes (Signed)
Pt. Transferred from Triage to hall 20 after dressing out and screening for contraband.  Pt. Oriented to Quad including Q15 minute rounds as well as Engineer, drilling for their protection. Patient is alert and oriented, warm and dry in no acute distress. Patient denies SI, HI, and AVH. Pt. Encouraged to let me know if needs arise.

## 2018-09-09 NOTE — ED Provider Notes (Signed)
-----------------------------------------   11:45 PM on 09/09/2018 -----------------------------------------   Blood pressure (!) 155/103, pulse (!) 108, temperature 99.3 F (37.4 C), temperature source Oral, resp. rate 20, SpO2 96 %.  The patient is calm and cooperative at this time.  There have been no acute events since the last update.  Evaluated by psychiatry who agrees that the patient is at high risk of suicide given his impulsive behavior and easy access to firearms.  Anticipates inpatient behavioral admission.   Hinda Kehr, MD 09/09/18 [phone]

## 2018-09-10 DIAGNOSIS — R45851 Suicidal ideations: Secondary | ICD-10-CM

## 2018-09-10 LAB — SARS CORONAVIRUS 2 BY RT PCR (HOSPITAL ORDER, PERFORMED IN ~~LOC~~ HOSPITAL LAB): SARS Coronavirus 2: NEGATIVE

## 2018-09-10 NOTE — ED Notes (Signed)
Hourly rounding reveals patient sleeping in room. No complaints, stable, in no acute distress. Q15 minute rounds and monitoring via Security Cameras to continue. 

## 2018-09-10 NOTE — ED Notes (Signed)
EMTALA reviewed by charge RN 

## 2018-09-10 NOTE — ED Notes (Signed)
BEHAVIORAL HEALTH ROUNDING Patient sleeping: No. Patient alert and oriented: yes Behavior appropriate: Yes.  ; If no, describe:  Nutrition and fluids offered: yes Toileting and hygiene offered: Yes  Sitter present: q15 minute observations and security camera monitoring Law enforcement present: Yes  ODS  

## 2018-09-10 NOTE — ED Notes (Signed)
Called [facility] for transport to [facility]

## 2018-09-10 NOTE — ED Notes (Signed)
Pt to the nurse's station  "When will I see the doctor - I am still waiting to see the doctor - Why has a doctor not seen me yet?"  Pt reassured  He is angry  He states  "seeing a student is not seeing the doctor - I was told I would see the doctor"

## 2018-09-10 NOTE — ED Notes (Signed)
Pt given meal tray and a sprite.

## 2018-09-10 NOTE — ED Notes (Signed)
Patient observed lying in bed with eyes closed  Even, unlabored respirations observed   NAD pt appears to be sleeping  I will continue to monitor along with every 15 minute visual observations and ongoing security camera monitoring    

## 2018-09-10 NOTE — ED Notes (Signed)

## 2018-09-10 NOTE — Consult Note (Signed)
[place] [facility] Psychiatry Consult   Reason for Consult: Suicidal ideation Referring Physician: Dr. Archie Balboa Patient Identification: [name] MRN:  [id number] Principal Diagnosis: <principal problem not specified> Diagnosis:  Active Problems:   Suicidal ideation   Total Time spent with patient: 1 hour  Subjective: "I thought I had nothing to lose and I went a little too far today." [name] is a 33 y.o. male patient presented to [facility] ED via law enforcement under involuntary commitment status (IVC). "I drank a little too much today and I taught I was going to lose everything."  "My kids means the world to me and I cannot stand the taught that I might lose them."  During the patient assessment he voiced that he drank a little too much today.  He stated he took a gun with him down the street and pointed it to his head stating he was going to blow his brains out.  The patient stated he is a concealed carrier.  He voiced that he has several weapons at home wish he has access to.  He stated that even though the cops took his gun away he still have access to weapons.  The patient continues to show a "nonchalant behavior" towards his actions.  The patient remain a high risk of suicide due to his impulsive behavior and easy access to him admitting to having several firearms at home.  The patient was seen face-to-face by this provider; chart reviewed and consulted with Dr.  Karma Greaser on 09/09/2018 due to the care of the patient. It was discussed with the provider that the patient does meet criteria to be admitted to the inpatient unit.  On evaluation the patient is alert and oriented x4, calm and cooperative, and mood-congruent with affect.  The patient does not appear to be responding to internal or external stimuli. Neither is the patient presenting with any delusional thinking. The patient denies auditory or visual hallucinations. The patient denies any suicidal, homicidal, or self-harm  ideations. The patient is not presenting with any psychotic or paranoid behaviors. During an encounter with the patient, he was able to answer questions appropriately.  Plan: The patient is a safety risk to self and does require psychiatric inpatient admission for stabilization and treatment.  HPI: Per Dr. Archie Balboa; [name] is a 33 y.o. male who presents to the emergency department today under IVC because of concerns for suicidal ideation.  Patient states that he had a lapse of judgment today.  He states that he told his wife that he wanted to legally separated but then heard from his girlfriend that she also wanted to break it off with him.  He states that in the moment he did think of hurting himself.  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  The patient denies any history of depression.  He denies trying to harm himself in the past.  He states that at this time he is mostly concerned about what will happen next.  Records reviewed. Per medical record review patient has a history of tobacco abuse.  Past Psychiatric History:  History reviewed. No pertinent past psychiatric history  Risk to Self:  Yes Risk to Others:  No Prior Inpatient Therapy:  No Prior Outpatient Therapy:  Yes  Past Medical History:  Past Medical History:  Diagnosis Date  . Lumbar herniated disc 2005   L5-S1   No past surgical history on file. Family History: No family history on file. Family Psychiatric  History:  History reviewed. No pertinent family psychiatric history Social History:  Social History   Substance and Sexual Activity  Alcohol Use None     Social History   Substance and Sexual Activity  Drug Use Not on file    Social History   Socioeconomic History  . Marital status: Single    Spouse name: Not on file  . Number of children: Not on file  . Years of education: Not on file  . Highest education level: Not on file  Occupational History  . Not on file  Social Needs  .  Financial resource strain: Not on file  . Food insecurity    Worry: Not on file    Inability: Not on file  . Transportation needs    Medical: Not on file    Non-medical: Not on file  Tobacco Use  . Smoking status: Current Every Day Smoker  . Smokeless tobacco: Never Used  Substance and Sexual Activity  . Alcohol use: Not on file  . Drug use: Not on file  . Sexual activity: Not on file  Lifestyle  . Physical activity    Days per week: Not on file    Minutes per session: Not on file  . Stress: Not on file  Relationships  . Social Herbalist on phone: Not on file    Gets together: Not on file    Attends religious service: Not on file    Active member of club or organization: Not on file    Attends meetings of clubs or organizations: Not on file    Relationship status: Not on file  Other Topics Concern  . Not on file  Social History Narrative  . Not on file   Additional Social History:    Allergies:  No Known Allergies  Labs:  Results for orders placed or performed during the hospital encounter of 09/09/18 (from the past 48 hour(s))  Comprehensive metabolic panel     Status: Abnormal   Collection Time: 09/09/18  9:04 PM  Result Value Ref Range   Sodium 139 135 - 145 mmol/L   Potassium 3.2 (L) 3.5 - 5.1 mmol/L   Chloride 104 98 - 111 mmol/L   CO2 23 22 - 32 mmol/L   Glucose, Bld 99 70 - 99 mg/dL   BUN 8 6 - 20 mg/dL   Creatinine, Ser 0.93 0.61 - 1.24 mg/dL   Calcium 9.2 8.9 - 10.3 mg/dL   Total Protein 8.1 6.5 - 8.1 g/dL   Albumin 4.9 3.5 - 5.0 g/dL   AST 21 15 - 41 U/L   ALT 24 0 - 44 U/L   Alkaline Phosphatase 47 38 - 126 U/L   Total Bilirubin 1.0 0.3 - 1.2 mg/dL   GFR calc non Af Amer >60 >60 mL/min   GFR calc Af Amer >60 >60 mL/min   Anion gap 12 5 - 15    Comment: Performed at [facility], [address], [place], [place] [address]  Ethanol     Status: Abnormal   Collection Time: 09/09/18  9:04 PM  Result Value Ref Range   Alcohol,  Ethyl (B) 97 (H) <10 mg/dL    Comment: (NOTE) Lowest detectable limit for serum alcohol is 10 mg/dL. For medical purposes only. Performed at [facility], [address] [place], [place] [address]   Salicylate level     Status: None   Collection Time: 09/09/18  9:04 PM  Result Value Ref Range  Salicylate Lvl <7.4 2.8 - 30.0 mg/dL    Comment: Performed at [facility], [place], [place] [address]  Acetaminophen level     Status: Abnormal   Collection Time: 09/09/18  9:04 PM  Result Value Ref Range   Acetaminophen (Tylenol), Serum <10 (L) 10 - 30 ug/mL    Comment: (NOTE) Therapeutic concentrations vary significantly. A range of 10-30 ug/mL  may be an effective concentration for many patients. However, some  are best treated at concentrations outside of this range. Acetaminophen concentrations >150 ug/mL at 4 hours after ingestion  and >50 ug/mL at 12 hours after ingestion are often associated with  toxic reactions. Performed at [facility], [address] [place], [place] [address]   cbc     Status: Abnormal   Collection Time: 09/09/18  9:04 PM  Result Value Ref Range   WBC 9.7 4.0 - 10.5 K/uL   RBC 5.58 4.22 - 5.81 MIL/uL   Hemoglobin 17.8 (H) 13.0 - 17.0 g/dL   HCT 48.4 39.0 - 52.0 %   MCV 86.7 80.0 - 100.0 fL   MCH 31.9 26.0 - 34.0 pg   MCHC 36.8 (H) 30.0 - 36.0 g/dL   RDW 11.9 11.5 - 15.5 %   Platelets 252 150 - 400 K/uL   nRBC 0.0 0.0 - 0.2 %    Comment: Performed at [facility], [address] [place], [place] [address]  Urine Drug Screen, Qualitative     Status: None   Collection Time: 09/09/18  9:04 PM  Result Value Ref Range   Tricyclic, Ur Screen NONE DETECTED NONE DETECTED   Amphetamines, Ur Screen NONE DETECTED NONE DETECTED   MDMA (Ecstasy)Ur Screen NONE DETECTED NONE DETECTED   Cocaine Metabolite,Ur [place] NONE DETECTED NONE DETECTED   Opiate, Ur Screen NONE DETECTED NONE DETECTED   Phencyclidine  (PCP) Ur S NONE DETECTED NONE DETECTED   Cannabinoid 50 Ng, Ur [place] NONE DETECTED NONE DETECTED   Barbiturates, Ur Screen NONE DETECTED NONE DETECTED   Benzodiazepine, Ur Scrn NONE DETECTED NONE DETECTED   Methadone Scn, Ur NONE DETECTED NONE DETECTED    Comment: (NOTE) Tricyclics + metabolites, urine    Cutoff 1000 ng/mL Amphetamines + metabolites, urine  Cutoff 1000 ng/mL MDMA (Ecstasy), urine              Cutoff 500 ng/mL Cocaine Metabolite, urine          Cutoff 300 ng/mL Opiate + metabolites, urine        Cutoff 300 ng/mL Phencyclidine (PCP), urine         Cutoff 25 ng/mL Cannabinoid, urine                 Cutoff 50 ng/mL Barbiturates + metabolites, urine  Cutoff 200 ng/mL Benzodiazepine, urine              Cutoff 200 ng/mL Methadone, urine                   Cutoff 300 ng/mL The urine drug screen provides only a preliminary, unconfirmed analytical test result and should not be used for non-medical purposes. Clinical consideration and professional judgment should be applied to any positive drug screen result due to possible interfering substances. A more specific alternate chemical method must be used in order to obtain a confirmed analytical result. Gas chromatography / mass spectrometry (GC/MS) is the preferred confirmat ory method. Performed at [facility], [place], [place]  [address]     No current facility-administered medications for this encounter.    Current Outpatient Medications  Medication Sig Dispense Refill  . traMADol (ULTRAM) 50 MG tablet Take 1 tab up to every 6 hours as needed for severe breakthrough pain. 20 tablet 0  . varenicline (CHANTIX CONTINUING MONTH PAK) 1 MG tablet Take 1 tablet (1 mg total) by mouth 2 (two) times daily. 60 tablet 2    Musculoskeletal: Strength & Muscle Tone: within normal limits Gait & Station: normal Patient leans: N/A  Psychiatric Specialty Exam: Physical Exam  Nursing note and vitals  reviewed. Constitutional: He is oriented to person, place, and time. He appears well-developed and well-nourished.  HENT:  Head: Normocephalic.  Eyes: Pupils are equal, round, and reactive to light. Conjunctivae are normal.  Neck: Normal range of motion. Neck supple.  Cardiovascular: Normal rate and regular rhythm.  Respiratory: Effort normal.  Musculoskeletal: Normal range of motion.  Neurological: He is alert and oriented to person, place, and time.  Skin: Skin is warm and dry.    Review of Systems  Psychiatric/Behavioral: Positive for depression and suicidal ideas. The patient is nervous/anxious.   All other systems reviewed and are negative.   Blood pressure (!) 155/103, pulse (!) 108, temperature 99.3 F (37.4 C), temperature source Oral, resp. rate 20, SpO2 96 %.There is no height or weight on file to calculate BMI.  General Appearance: Fairly Groomed  Eye Contact:  Good  Speech:  Clear and Coherent  Volume:  Normal  Mood:  Anxious  Affect:  Depressed  Thought Process:  Coherent  Orientation:  Full (Time, Place, and Person)  Thought Content:  Logical  Suicidal Thoughts:  No  Homicidal Thoughts:  No  Memory:  Immediate;   Good Recent;   Good  Judgement:  Poor  Insight:  Lacking  Psychomotor Activity:  Normal  Concentration:  Concentration: Good and Attention Span: Good  Recall:  Good  Fund of Knowledge:  Good  Language:  Good  Akathisia:  Negative  Handed:  Right  AIMS (if indicated):     Assets:  Desire for Improvement Social Support  ADL's:  Intact  Cognition:  WNL  Sleep:   Okay     Treatment Plan Summary: Daily contact with patient to assess and evaluate symptoms and progress in treatment and Plan Patient does meet criteria for psychiatric inpatient admission due to his impulsive activity and him admitting to having access to firearms.  Disposition: Supportive therapy provided about ongoing stressors. The patient is at high risk of suicide given his  impulsive behavior and easy access to firearms.  Lamont Dowdy, NP 09/10/2018 12:06 AM

## 2018-09-10 NOTE — ED Notes (Signed)

## 2018-09-10 NOTE — BH Assessment (Signed)
Patient has been accepted to [facility].  Patient assigned to [facility]. Accepting physician is Dr. Jonelle Sports.  Call report to [phone].  Representative was [employer].   ER Staff is aware of it:  Anne Ng, ER Secretary  Dr. Myrene Buddy, ER MD  Amy T., Patient's Nurse

## 2018-09-10 NOTE — ED Notes (Signed)
BEHAVIORAL HEALTH ROUNDING Patient sleeping: No. Patient alert and oriented: yes Behavior appropriate: Yes  Angry  "I cannot believe that a counselor or a doctor has not seen me today"  Pt reassured  .  ; If no, describe:  Nutrition and fluids offered: yes Toileting and hygiene offered: Yes  Sitter present: q15 minute observations and security camera monitoring Law enforcement present: Yes  ODS

## 2018-11-09 ENCOUNTER — Other Ambulatory Visit: Payer: Self-pay | Admitting: Adult Health

## 2020-03-16 ENCOUNTER — Ambulatory Visit: Payer: 59 | Admitting: Adult Health Nurse Practitioner

## 2020-03-16 ENCOUNTER — Encounter: Payer: Self-pay | Admitting: Adult Health Nurse Practitioner

## 2020-03-16 ENCOUNTER — Other Ambulatory Visit: Payer: Self-pay

## 2020-03-16 VITALS — BP 140/86 | HR 89 | Temp 97.5°F | Ht 70.0 in | Wt 210.0 lb

## 2020-03-16 DIAGNOSIS — Z1329 Encounter for screening for other suspected endocrine disorder: Secondary | ICD-10-CM

## 2020-03-16 DIAGNOSIS — Z131 Encounter for screening for diabetes mellitus: Secondary | ICD-10-CM

## 2020-03-16 DIAGNOSIS — F5101 Primary insomnia: Secondary | ICD-10-CM

## 2020-03-16 DIAGNOSIS — N529 Male erectile dysfunction, unspecified: Secondary | ICD-10-CM

## 2020-03-16 DIAGNOSIS — L309 Dermatitis, unspecified: Secondary | ICD-10-CM

## 2020-03-16 DIAGNOSIS — Z136 Encounter for screening for cardiovascular disorders: Secondary | ICD-10-CM

## 2020-03-16 DIAGNOSIS — E559 Vitamin D deficiency, unspecified: Secondary | ICD-10-CM

## 2020-03-16 DIAGNOSIS — Z720 Tobacco use: Secondary | ICD-10-CM

## 2020-03-16 DIAGNOSIS — R591 Generalized enlarged lymph nodes: Secondary | ICD-10-CM

## 2020-03-16 DIAGNOSIS — E569 Vitamin deficiency, unspecified: Secondary | ICD-10-CM

## 2020-03-16 DIAGNOSIS — Z0000 Encounter for general adult medical examination without abnormal findings: Secondary | ICD-10-CM | POA: Diagnosis not present

## 2020-03-16 DIAGNOSIS — Z13220 Encounter for screening for lipoid disorders: Secondary | ICD-10-CM

## 2020-03-16 DIAGNOSIS — R7309 Other abnormal glucose: Secondary | ICD-10-CM

## 2020-03-16 DIAGNOSIS — I10 Essential (primary) hypertension: Secondary | ICD-10-CM

## 2020-03-16 DIAGNOSIS — Z1389 Encounter for screening for other disorder: Secondary | ICD-10-CM

## 2020-03-16 DIAGNOSIS — K219 Gastro-esophageal reflux disease without esophagitis: Secondary | ICD-10-CM

## 2020-03-16 DIAGNOSIS — Z130 Encounter for screening for diseases of the blood and blood-forming organs and certain disorders involving the immune mechanism: Secondary | ICD-10-CM

## 2020-03-16 MED ORDER — DEXAMETHASONE 4 MG PO TABS: ORAL_TABLET | ORAL | 1 refills | Status: DC

## 2020-03-16 MED ORDER — TRIAMCINOLONE ACETONIDE 0.1 % EX OINT: 1.0000 "application " | TOPICAL_OINTMENT | Freq: Two times a day (BID) | CUTANEOUS | 1 refills | Status: DC

## 2020-03-16 MED ORDER — FAMOTIDINE 20 MG PO TABS: 20.0000 mg | ORAL_TABLET | Freq: Two times a day (BID) | ORAL | 1 refills | Status: DC

## 2020-03-16 MED ORDER — TRAZODONE HCL 100 MG PO TABS: 100.0000 mg | ORAL_TABLET | Freq: Every day | ORAL | 0 refills | Status: DC

## 2020-03-16 NOTE — Progress Notes (Signed)
Complete Physical   Assessment and Plan: Health Maintenance- Discussed STD testing, safe sex, alcohol and drug awareness, drinking and driving dangers, wearing a seat belt and general safety measures for young adult.  [name] was seen today for annual exam and see med list.  Diagnoses and all orders for this visit:  Encounter for annual physical exam Yearly -     CBC with Differential/Platelet -     COMPLETE METABOLIC PANEL WITH GFR  Tobacco abuse Discussed cessation  Screening, ischemic heart disease -     EKG 12-Lead  Screening for thyroid disorder -     TSH  Screening for diabetes mellitus -A1c Screening, iron deficiency anemia -     Iron,Total/Total Iron Binding Cap  Vitamin deficiency -     Vitamin B12  Screening for blood or protein in urine -     Urinalysis w microscopic + reflex cultur  Vitamin D deficiency No supplementation at this time -     VITAMIN D 25 Hydroxy (Vit-D Deficiency, Fractures)  Screening for lipid disorders -     Lipid panel  Abnormal glucose -     Hemoglobin A1c  Lymphadenopathy -     Discontinue: dexamethasone (DECADRON) 4 MG tablet; Take 1 tab 3 x day - 3 days, then 2 x day - 3 days, then 1 tab daily  Erectile dysfunction, unspecified erectile dysfunction type -     Testosterone Will discuss further at follow up  Primary insomnia -     Discontinue: traZODone (DESYREL) 100 MG tablet; Take 1 tablet (100 mg total) by mouth at bedtime. -     traZODone (DESYREL) 100 MG tablet; Take 1 tablet (100 mg total) by mouth at bedtime.  Eczema, unspecified type -     triamcinolone ointment (KENALOG) 0.1 %; Apply 1 application topically 2 (two) times daily.  Gastroesophageal reflux disease without esophagitis -     famotidine (PEPCID) 20 MG tablet; Take 1 tablet (20 mg total) by mouth 2 (two) times daily.     Discussed med's effects and SE's. Screening labs and tests as requested with regular follow-up as recommended. Over 40 minutes  of face to face interview,  exam, counseling, chart review and critical decision making was performed   HPI  This very nice 35 y.o.male presents for complete physical.   He was last seen over two years ago.  He has multiple concerns today that been chronic in nature.  Reports that he has a right side lymph node that is swollen, noticed about one year ago.  Reports non-tender but can be after messing with the area. Denies fluctuation of this, reports some swallowing difficulties. Denies any coughing with eating or drinking.   He is also having some GI concerns, more indigestion.  He reports it doesn't matter what foods he eats.  It is intermittent in nature.  He is taking tums that helps for sa short period of time. Has never taken any treatments for this but a chronic battle.   2018-Dr Cabbell lumbar discectomy & fusion  He is divorced, has two children 11 & 14. He has a partner, male, sexually active, declines STD exposure or screening today.  He does not workout.  Has been talking about increasing activity with girlfriend.  Finally, patient has history of Vitamin D Deficiency, not been taking supplementation at this time and last vitamin D was  Lab Results  Component Value Date   VD25OH 33 04/04/2015  .  Currently on supplementation  Current Medications:  No current outpatient medications on file prior to visit.   No current facility-administered medications on file prior to visit.   Health Maintenance:   Immunization History  Administered Date(s) Administered  . PFIZER SARS-COV-2 Vaccination 11/20/2019, 12/23/2019    TD/TDAP: unknown Influenza: Declines Pneumovax: N/A    Sexually Active: yes STD testing offered, declined Last Dental Exam: 08/2019 Last Eye Exam: DUE  Allergies: No Known Allergies Medical History:  has Tobacco abuse and Suicidal ideation on their problem list. Surgical History:  He  has no past surgical history on file. Family History:  Hisfamily  history includes CAD in his father. Social History:   reports that he has been smoking cigarettes. He has never used smokeless tobacco. He reports current alcohol use of about 2.0 standard drinks of alcohol per week. No history on file for drug use.  Review of Systems: Review of Systems  Constitutional: Negative for chills, diaphoresis, fever, malaise/fatigue and weight loss.  HENT: Negative for congestion, ear discharge, ear pain, hearing loss, nosebleeds, sinus pain, sore throat and tinnitus.        Swollen lymph node, right jawline  Eyes: Negative for blurred vision, double vision, photophobia, pain, discharge and redness.  Respiratory: Negative for cough, hemoptysis, sputum production, shortness of breath, wheezing and stridor.   Cardiovascular: Negative for chest pain, palpitations, orthopnea, claudication, leg swelling and PND.  Gastrointestinal: Negative for abdominal pain, blood in stool, constipation, diarrhea, heartburn, melena, nausea and vomiting.  Genitourinary: Negative for dysuria, flank pain, frequency, hematuria and urgency.  Musculoskeletal: Positive for back pain. Negative for falls, joint pain, myalgias and neck pain.  Skin: Negative for itching and rash.  Neurological: Negative for dizziness, tingling, tremors, sensory change, speech change, focal weakness, seizures, loss of consciousness, weakness and headaches.  Endo/Heme/Allergies: Negative for environmental allergies and polydipsia. Does not bruise/bleed easily.  Psychiatric/Behavioral: Negative for depression, hallucinations, memory loss, substance abuse and suicidal ideas. The patient is not nervous/anxious and does not have insomnia.     Physical Exam: Estimated body mass index is 30.13 kg/m as calculated from the following:   Height as of this encounter: 5\' 10"  (1.778 m).   Weight as of this encounter: 210 lb (95.3 kg). BP 140/86   Pulse 89   Temp (!) 97.5 F (36.4 C)   Ht 5\' 10"  (1.778 m)   Wt 210 lb (95.3  kg)   SpO2 97%   BMI 30.13 kg/m    General Appearance: Well nourished, in no apparent distress.  Eyes: PERRLA, EOMs, conjunctiva no swelling or erythema, normal fundi and vessels.  Sinuses: No Frontal/maxillary tenderness  ENT/Mouth: Ext aud canals clear, normal light reflex with TMs without erythema, bulging. Good dentition. No erythema, swelling, or exudate on post pharynx. Tonsils not swollen or erythematous. Hearing normal. Submandibular , right, marble in size. Neck: Supple, thyroid normal. No bruits  Respiratory: Respiratory effort normal, BS equal bilaterally without rales, rhonchi, wheezing or stridor.  Cardio: RRR without murmurs, rubs or gallops. Brisk peripheral pulses without edema.  Chest: symmetric, with normal excursions and percussion.  Abdomen: Soft, nontender, no guarding, rebound, hernias, masses, or organomegaly.  Lymphatics: Non tender without lymphadenopathy.  Genitourinary: defer Musculoskeletal: Full ROM all peripheral extremities,5/5 strength, and normal gait.  Skin: Warm, dry without rashes, lesions, ecchymosis. Neuro: Cranial nerves intact, reflexes equal bilaterally. Normal muscle tone, no cerebellar symptoms. Sensation intact.  Psych: Awake and oriented X 3, normal affect, Insight and Judgment appropriate.    EKG: NSR   [name], AGNP-C, DNP  [facility] 03/16/2020  1:54 PM

## 2020-03-17 LAB — CBC WITH DIFFERENTIAL/PLATELET
Absolute Monocytes: 469 cells/uL (ref 200–950)
Basophils Absolute: 80 cells/uL (ref 0–200)
Basophils Relative: 1.2 %
Eosinophils Absolute: 261 cells/uL (ref 15–500)
Eosinophils Relative: 3.9 %
HCT: 47.2 % (ref 38.5–50.0)
Hemoglobin: 16.6 g/dL (ref 13.2–17.1)
Lymphs Abs: 2077 cells/uL (ref 850–3900)
MCH: 31.2 pg (ref 27.0–33.0)
MCHC: 35.2 g/dL (ref 32.0–36.0)
MCV: 88.7 fL (ref 80.0–100.0)
MPV: 9.6 fL (ref 7.5–12.5)
Monocytes Relative: 7 %
Neutro Abs: 3812 cells/uL (ref 1500–7800)
Neutrophils Relative %: 56.9 %
Platelets: 253 10*3/uL (ref 140–400)
RBC: 5.32 10*6/uL (ref 4.20–5.80)
RDW: 12.3 % (ref 11.0–15.0)
Total Lymphocyte: 31 %
WBC: 6.7 10*3/uL (ref 3.8–10.8)

## 2020-03-17 LAB — URINALYSIS W MICROSCOPIC + REFLEX CULTURE
Bacteria, UA: NONE SEEN /HPF
Bilirubin Urine: NEGATIVE
Glucose, UA: NEGATIVE
Hgb urine dipstick: NEGATIVE
Hyaline Cast: NONE SEEN /LPF
Ketones, ur: NEGATIVE
Leukocyte Esterase: NEGATIVE
Nitrites, Initial: NEGATIVE
Protein, ur: NEGATIVE
RBC / HPF: NONE SEEN /HPF (ref 0–2)
Specific Gravity, Urine: 1.011 (ref 1.001–1.03)
Squamous Epithelial / HPF: NONE SEEN /HPF (ref <=5)
WBC, UA: NONE SEEN /HPF (ref 0–5)
pH: 6.5 (ref 5.0–8.0)

## 2020-03-17 LAB — COMPLETE METABOLIC PANEL WITH GFR
AG Ratio: 1.8 (calc) (ref 1.0–2.5)
ALT: 32 U/L (ref 9–46)
AST: 22 U/L (ref 10–40)
Albumin: 4.8 g/dL (ref 3.6–5.1)
Alkaline phosphatase (APISO): 37 U/L (ref 36–130)
BUN: 11 mg/dL (ref 7–25)
CO2: 26 mmol/L (ref 20–32)
Calcium: 9.9 mg/dL (ref 8.6–10.3)
Chloride: 102 mmol/L (ref 98–110)
Creat: 1.14 mg/dL (ref 0.60–1.35)
GFR, Est African American: 96 mL/min/{1.73_m2} (ref >=60)
GFR, Est Non African American: 83 mL/min/{1.73_m2} (ref >=60)
Globulin: 2.7 g/dL (calc) (ref 1.9–3.7)
Glucose, Bld: 109 mg/dL — ABNORMAL HIGH (ref 65–99)
Potassium: 4.6 mmol/L (ref 3.5–5.3)
Sodium: 139 mmol/L (ref 135–146)
Total Bilirubin: 0.8 mg/dL (ref 0.2–1.2)
Total Protein: 7.5 g/dL (ref 6.1–8.1)

## 2020-03-17 LAB — LIPID PANEL
Cholesterol: 234 mg/dL — ABNORMAL HIGH (ref <200)
HDL: 37 mg/dL — ABNORMAL LOW (ref >=40)
LDL Cholesterol (Calc): 149 mg/dL (calc) — ABNORMAL HIGH
Non-HDL Cholesterol (Calc): 197 mg/dL (calc) — ABNORMAL HIGH (ref <130)
Total CHOL/HDL Ratio: 6.3 (calc) — ABNORMAL HIGH (ref <5.0)
Triglycerides: 292 mg/dL — ABNORMAL HIGH (ref <150)

## 2020-03-17 LAB — TESTOSTERONE: Testosterone: 415 ng/dL (ref 250–827)

## 2020-03-17 LAB — VITAMIN B12: Vitamin B-12: 410 pg/mL (ref 200–1100)

## 2020-03-17 LAB — VITAMIN D 25 HYDROXY (VIT D DEFICIENCY, FRACTURES): Vit D, 25-Hydroxy: 27 ng/mL — ABNORMAL LOW (ref 30–100)

## 2020-03-17 LAB — HEMOGLOBIN A1C
Hgb A1c MFr Bld: 5.2 % of total Hgb (ref <5.7)
Mean Plasma Glucose: 103 mg/dL
eAG (mmol/L): 5.7 mmol/L

## 2020-03-17 LAB — IRON, TOTAL/TOTAL IRON BINDING CAP
%SAT: 42 % (calc) (ref 20–48)
Iron: 136 ug/dL (ref 50–180)
TIBC: 327 mcg/dL (calc) (ref 250–425)

## 2020-03-17 LAB — NO CULTURE INDICATED

## 2020-03-17 LAB — TSH: TSH: 1.65 mIU/L (ref 0.40–4.50)

## 2020-03-18 ENCOUNTER — Other Ambulatory Visit: Payer: Self-pay | Admitting: Adult Health Nurse Practitioner

## 2020-03-18 DIAGNOSIS — E559 Vitamin D deficiency, unspecified: Secondary | ICD-10-CM

## 2020-03-18 MED ORDER — CHOLECALCIFEROL 1.25 MG (50000 UT) PO CAPS: ORAL_CAPSULE | ORAL | 0 refills | Status: DC

## 2020-04-12 ENCOUNTER — Ambulatory Visit: Payer: 59 | Admitting: Adult Health Nurse Practitioner

## 2020-04-12 ENCOUNTER — Encounter: Payer: Self-pay | Admitting: Adult Health Nurse Practitioner

## 2020-04-12 ENCOUNTER — Other Ambulatory Visit: Payer: Self-pay

## 2020-04-12 VITALS — BP 128/88 | HR 106 | Temp 97.6°F | Ht 70.0 in | Wt 208.0 lb

## 2020-04-12 DIAGNOSIS — N529 Male erectile dysfunction, unspecified: Secondary | ICD-10-CM | POA: Diagnosis not present

## 2020-04-12 DIAGNOSIS — K219 Gastro-esophageal reflux disease without esophagitis: Secondary | ICD-10-CM

## 2020-04-12 DIAGNOSIS — R591 Generalized enlarged lymph nodes: Secondary | ICD-10-CM | POA: Diagnosis not present

## 2020-04-12 MED ORDER — FAMOTIDINE 20 MG PO TABS: 20.0000 mg | ORAL_TABLET | ORAL | 1 refills | Status: DC | PRN

## 2020-04-12 MED ORDER — SILDENAFIL CITRATE 100 MG PO TABS: 100.0000 mg | ORAL_TABLET | ORAL | 1 refills | Status: DC | PRN

## 2020-04-12 NOTE — Progress Notes (Signed)
FOLLOW UP   Assessment and Plan: Health Maintenance- Discussed STD testing, safe sex, alcohol and drug awareness, drinking and driving dangers, wearing a seat belt and general safety measures for young adult.  Avishai was seen today for follow up Diagnoses and all orders for this visit:  Lymphadenopathy Improved, remains present Continue to monitor  Erectile dysfunction, unspecified erectile dysfunction type -Viagra discussed medication and use Will discuss further at follow up   Gastroesophageal reflux disease without esophagitis -     famotidine (PEPCID) 20 MG tablet; Take 1 tablet (20 mg total) by mouth 2 (two) times daily.   Discussed med's effects and SE's. Screening labs and tests as requested with regular follow-up as recommended.  Over 40 minutes of face to face interview,  exam, counseling, chart review and critical decision making was performed   HPI  This very nice 35 y.o.male presents for follow up from physical and lymphedema.  He completed steroid taper and reports improvement, did not completely resolve.  Does not bother him. He has starting changing his diet and working on improving his physical activity as well. Reports he would like to try something for Ed.  Reports ability to have erection, not maintain.  He has not tried anything in the past for this.   From last OV: 03/16/20 He was last seen over two years ago.  He has multiple concerns today that been chronic in nature.  Reports that he has a right side lymph node that is swollen, noticed about one year ago.  Reports non-tender but can be after messing with the area. Denies fluctuation of this, reports some swallowing difficulties. Denies any coughing with eating or drinking.   He is also having some GI concerns, more indigestion.  He reports it doesn't matter what foods he eats.  It is intermittent in nature.  He is taking tums that helps for sa short period of time. Has never taken any treatments for  this but a chronic battle.   2018-Dr Cabbell lumbar discectomy & fusion  He is divorced, has two children 11 & 14. He has a partner, male, sexually active, declines STD exposure or screening today.  He does not workout.  Has been talking about increasing activity with girlfriend.  Finally, patient has history of Vitamin D Deficiency, not been taking supplementation at this time and last vitamin D was  Lab Results  Component Value Date   VD25OH 27 (L) 03/16/2020  .  Currently on supplementation  Current Medications:  Current Outpatient Medications on File Prior to Visit  Medication Sig Dispense Refill  . Cholecalciferol 1.25 MG (50000 UT) capsule Take one tablet by mouth three days a week for twelve weeks. 36 capsule 0  . famotidine (PEPCID) 20 MG tablet Take 1 tablet (20 mg total) by mouth 2 (two) times daily. 180 tablet 1  . traZODone (DESYREL) 100 MG tablet Take 1 tablet (100 mg total) by mouth at bedtime. 30 tablet 0  . triamcinolone ointment (KENALOG) 0.1 % Apply 1 application topically 2 (two) times daily. 80 g 1   No current facility-administered medications on file prior to visit.   Health Maintenance:   Immunization History  Administered Date(s) Administered  . PFIZER(Purple Top)SARS-COV-2 Vaccination 11/20/2019, 12/23/2019    TD/TDAP: unknown Influenza: Declines Pneumovax: N/A    Sexually Active: yes STD testing offered, declined Last Dental Exam: 08/2019 Last Eye Exam: DUE  Allergies: No Known Allergies Medical History:  has Tobacco abuse and Suicidal ideation on their problem list. Surgical  History:  He  has no past surgical history on file. Family History:  Hisfamily history includes CAD in his father. Social History:   reports that he has been smoking cigarettes. He has never used smokeless tobacco. He reports current alcohol use of about 2.0 standard drinks of alcohol per week. No history on file for drug use.  Review of Systems: Review of Systems   Constitutional: Negative for chills, diaphoresis, fever, malaise/fatigue and weight loss.  HENT: Negative for congestion, ear discharge, ear pain, hearing loss, nosebleeds, sinus pain, sore throat and tinnitus.        Swollen lymph node, right jawline  Eyes: Negative for blurred vision, double vision, photophobia, pain, discharge and redness.  Respiratory: Negative for cough, hemoptysis, sputum production, shortness of breath, wheezing and stridor.   Cardiovascular: Negative for chest pain, palpitations, orthopnea, claudication, leg swelling and PND.  Gastrointestinal: Negative for abdominal pain, blood in stool, constipation, diarrhea, heartburn, melena, nausea and vomiting.  Genitourinary: Negative for dysuria, flank pain, frequency, hematuria and urgency.  Musculoskeletal: Negative for back pain, falls, joint pain, myalgias and neck pain.  Skin: Negative for itching and rash.  Neurological: Negative for dizziness, tingling, tremors, sensory change, speech change, focal weakness, seizures, loss of consciousness, weakness and headaches.  Endo/Heme/Allergies: Negative for environmental allergies and polydipsia. Does not bruise/bleed easily.  Psychiatric/Behavioral: Negative for depression, hallucinations, memory loss, substance abuse and suicidal ideas. The patient is not nervous/anxious and does not have insomnia.     Physical Exam: Estimated body mass index is 29.84 kg/m as calculated from the following:   Height as of this encounter: 5\' 10"  (1.778 m).   Weight as of this encounter: 208 lb (94.3 kg). BP 128/88   Pulse (!) 106   Temp 97.6 F (36.4 C)   Ht 5\' 10"  (1.778 m)   Wt 208 lb (94.3 kg)   SpO2 96%   BMI 29.84 kg/m    General Appearance: Well nourished, in no apparent distress.  Eyes: PERRLA, EOMs, conjunctiva no swelling or erythema, normal fundi and vessels.  Sinuses: No Frontal/maxillary tenderness  ENT/Mouth: Ext aud canals clear, normal light reflex with TMs without  erythema, bulging. Good dentition. No erythema, swelling, or exudate on post pharynx. Tonsils not swollen or erythematous. Hearing normal. Submandibular , right, pea in size. Neck: Supple, thyroid normal. No bruits  Respiratory: Respiratory effort normal, BS equal bilaterally without rales, rhonchi, wheezing or stridor.  Cardio: RRR without murmurs, rubs or gallops. Brisk peripheral pulses without edema.  Chest: symmetric, with normal excursions and percussion.  Abdomen: Soft, nontender, no guarding, rebound, hernias, masses, or organomegaly.  Lymphatics: Non tender without lymphadenopathy.  Genitourinary: defer Musculoskeletal: Full ROM all peripheral extremities,5/5 strength, and normal gait.  Skin: Warm, dry without rashes, lesions, ecchymosis. Neuro: Cranial nerves intact, reflexes equal bilaterally. Normal muscle tone, no cerebellar symptoms. Sensation intact.  Psych: Awake and oriented X 3, normal affect, Insight and Judgment appropriate.      Garnet Sierras, Laqueta Jean, DNP [facility] 04/12/2020  2:44 PM

## 2020-10-14 NOTE — Progress Notes (Signed)
FOLLOW UP  Assessment and Plan:   [name] was seen today for follow-up.  Diagnoses and all orders for this visit:  Vitamin D deficiency -     VITAMIN D 25 Hydroxy (Vit-D Deficiency, Fractures)       - Encouraged to take Vit D3 5000 units daily  Tobacco abuse       - Counseled on smoking cessation and importance with new baby coming.   Erectile dysfunction, unspecified erectile dysfunction type       - Continue sidenafil as needed, advised smoking cessation could help prevent worsening of this issue  Gastroesophageal reflux disease without esophagitis -     CBC with Differential/Platelet       - Continue Pepcid as needed  Hyperlipidemia, unspecified hyperlipidemia type -     CBC with Differential/Platelet -     COMPLETE METABOLIC PANEL WITH GFR -     Lipid panel       - Counseled on diet and exercise, stressed importance   Primary insomnia       - Continue Trazodone PRN and practice good sleep hygiene  Overweight with body mass index (BMI) of 29 to 29.9 in adult        - Continue to work on exercise and diet  Need for Tdap vaccination       - Pt is having new baby and was advised he is to get Tdap vaccine up to date, immunization done today  Lymphadenopathy       - Advise pt to avoid touching for several and weeks and monitor size and shape, if increases in size or becomes more tender he is to notify office and will refer for biopsy.   Continue diet and meds as discussed. Further disposition pending results of labs. Discussed med's effects and SE's.   Over 30 minutes of exam, counseling, chart review, and critical decision making was performed.   Future Appointments  Date Time Provider [place]  03/20/2021 10:00 AM Magda Bernheim, NP [id number] None    ----------------------------------------------------------------------------------------------------------------------  HPI 35 y.o. male  presents for 3 month follow up on tobacco use, hyperlipidemia, erectile  dysfunction, GERD and insomnia.  Pt is having another baby, due 01/02/2021, its a boy.  Was advised he is to have an updated Tdap.  BMI is Body mass index is 29.73 kg/m., he has been working on diet and exercise. Wt Readings from Last 3 Encounters:  10/17/20 207 lb 3.2 oz (94 kg)  04/12/20 208 lb (94.3 kg)  03/16/20 210 lb (95.3 kg)   Pt continues to smoke daily, had quit for 2 months but restarted.   GERD is well controlled with Pepcid unless he eats spicy foods.  Continues to use Sidenafil for erectile dysfunction as needed.  No side effects from medication.   His blood pressure has been controlled at home, today their BP is BP: 120/78 He does workout. He denies chest pain, shortness of breath, dizziness.   He is not on cholesterol medication . His cholesterol is not at goal. The cholesterol last visit was:   Lab Results  Component Value Date   CHOL 234 (H) 03/16/2020   HDL 37 (L) 03/16/2020   LDLCALC 149 (H) 03/16/2020   TRIG 292 (H) 03/16/2020   CHOLHDL 6.3 (H) 03/16/2020   He continues to use Trazodone as needed for sleep.  No side effects and is working well.   Patient is not on Vitamin D supplement.   Lab Results  Component Value Date  VD25OH 27 (L) 03/16/2020        Current Medications:  Current Outpatient Medications on File Prior to Visit  Medication Sig   Cholecalciferol 1.25 MG (50000 UT) capsule Take one tablet by mouth three days a week for twelve weeks.   famotidine (PEPCID) 20 MG tablet Take 1 tablet (20 mg total) by mouth as needed for heartburn or indigestion.   sildenafil (VIAGRA) 100 MG tablet Take 1 tablet (100 mg total) by mouth as needed for erectile dysfunction.   traZODone (DESYREL) 100 MG tablet Take 1 tablet (100 mg total) by mouth at bedtime.   triamcinolone ointment (KENALOG) 0.1 % Apply 1 application topically 2 (two) times daily.   No current facility-administered medications on file prior to visit.     Allergies: No Known Allergies    Medical History:  Past Medical History:  Diagnosis Date   Lumbar herniated disc 2005   L5-S1   Family history- Reviewed and unchanged Social history- Reviewed and unchanged   Review of Systems:  Review of Systems  Constitutional:  Negative for chills, fever and weight loss.  HENT:  Negative for congestion and hearing loss.   Eyes:  Negative for blurred vision and double vision.  Respiratory:  Negative for cough and shortness of breath.   Cardiovascular:  Negative for chest pain, palpitations, orthopnea and leg swelling.  Gastrointestinal:  Negative for abdominal pain, constipation, diarrhea, heartburn, nausea and vomiting.  Musculoskeletal:  Negative for falls, joint pain and myalgias.  Skin:  Negative for rash.  Neurological:  Negative for dizziness, tingling, tremors, loss of consciousness and headaches.  Psychiatric/Behavioral:  Negative for depression, memory loss and suicidal ideas.      Physical Exam: BP 120/78   Pulse 78   Temp 97.8 F (36.6 C)   Resp 16   Ht '5\' 10"'$  (1.778 m)   Wt 207 lb 3.2 oz (94 kg)   SpO2 98%   BMI 29.73 kg/m  Wt Readings from Last 3 Encounters:  10/17/20 207 lb 3.2 oz (94 kg)  04/12/20 208 lb (94.3 kg)  03/16/20 210 lb (95.3 kg)   General Appearance: Well nourished, in no apparent distress. Eyes: PERRLA, EOMs, conjunctiva no swelling or erythema Sinuses: No Frontal/maxillary tenderness ENT/Mouth: Ext aud canals clear, TMs without erythema, bulging. No erythema, swelling, or exudate on post pharynx.  Tonsils not swollen or erythematous. Hearing normal.  Neck: Supple, thyroid normal.  Respiratory: Respiratory effort normal, BS equal bilaterally without rales, rhonchi, wheezing or stridor.  Cardio: RRR with no MRGs. Brisk peripheral pulses without edema.  Abdomen: Soft, + BS.  Non tender, no guarding, rebound, hernias, masses. Lymphatics: small right submandibular lymph node, nontender.  Musculoskeletal: Full ROM, 5/5 strength, Normal  gait Skin: Warm, dry without rashes, lesions, ecchymosis.  Neuro: Cranial nerves intact. No cerebellar symptoms.  Psych: Awake and oriented X 3, normal affect, Insight and Judgment appropriate.    Magda Bernheim, NP 9:12 AM [facility]

## 2020-10-17 ENCOUNTER — Ambulatory Visit: Payer: 59 | Admitting: Adult Health Nurse Practitioner

## 2020-10-17 ENCOUNTER — Encounter: Payer: Self-pay | Admitting: Nurse Practitioner

## 2020-10-17 ENCOUNTER — Other Ambulatory Visit: Payer: Self-pay

## 2020-10-17 ENCOUNTER — Ambulatory Visit: Payer: 59 | Admitting: Nurse Practitioner

## 2020-10-17 VITALS — BP 120/78 | HR 78 | Temp 97.8°F | Resp 16 | Ht 70.0 in | Wt 207.2 lb

## 2020-10-17 DIAGNOSIS — R7309 Other abnormal glucose: Secondary | ICD-10-CM

## 2020-10-17 DIAGNOSIS — Z23 Encounter for immunization: Secondary | ICD-10-CM | POA: Diagnosis not present

## 2020-10-17 DIAGNOSIS — F5101 Primary insomnia: Secondary | ICD-10-CM

## 2020-10-17 DIAGNOSIS — K219 Gastro-esophageal reflux disease without esophagitis: Secondary | ICD-10-CM

## 2020-10-17 DIAGNOSIS — N529 Male erectile dysfunction, unspecified: Secondary | ICD-10-CM

## 2020-10-17 DIAGNOSIS — E785 Hyperlipidemia, unspecified: Secondary | ICD-10-CM

## 2020-10-17 DIAGNOSIS — E663 Overweight: Secondary | ICD-10-CM

## 2020-10-17 DIAGNOSIS — E559 Vitamin D deficiency, unspecified: Secondary | ICD-10-CM

## 2020-10-17 DIAGNOSIS — Z720 Tobacco use: Secondary | ICD-10-CM | POA: Diagnosis not present

## 2020-10-17 DIAGNOSIS — E6609 Other obesity due to excess calories: Secondary | ICD-10-CM

## 2020-10-17 DIAGNOSIS — R591 Generalized enlarged lymph nodes: Secondary | ICD-10-CM

## 2020-10-17 DIAGNOSIS — Z6829 Body mass index (BMI) 29.0-29.9, adult: Secondary | ICD-10-CM

## 2020-10-17 NOTE — Patient Instructions (Signed)
Tdap (Tetanus, Diphtheria, Pertussis) Vaccine: What You Need to Know 1. Why get vaccinated? Tdap vaccine can prevent tetanus, diphtheria, and pertussis. Diphtheria and pertussis spread from person to person. Tetanus enters the body through cuts or wounds. TETANUS (T) causes painful stiffening of the muscles. Tetanus can lead to serious health problems, including being unable to open the mouth, having trouble swallowing and breathing, or death. DIPHTHERIA (D) can lead to difficulty breathing, heart failure, paralysis, or death. PERTUSSIS (aP), also known as "whooping cough," can cause uncontrollable, violent coughing that makes it hard to breathe, eat, or drink. Pertussis can be extremely serious especially in babies and young children, causing pneumonia, convulsions, brain damage, or death. In teens and adults, it can cause weight loss, loss of bladder control, passing out, and rib fractures from severe coughing. 2. Tdap vaccine Tdap is only for children 7 years and older, adolescents, and adults.  Adolescents should receive a single dose of Tdap, preferably at age 11 or 12 years. Pregnant people should get a dose of Tdap during every pregnancy, preferably during the early part of the third trimester, to help protect the newborn from pertussis. Infants are most at risk for severe, life-threatening complications frompertussis. Adults who have never received Tdap should get a dose of Tdap. Also, adults should receive a booster dose of either Tdap or Td (a different vaccine that protects against tetanus and diphtheria but not pertussis) every 10 years, or after 5 years in the case of a severe or dirty wound or burn. Tdap may be given at the same time as other vaccines. 3. Talk with your health care provider Tell your vaccine provider if the person getting the vaccine: Has had an allergic reaction after a previous dose of any vaccine that protects against tetanus, diphtheria, or pertussis, or has any  severe, life-threatening allergies Has had a coma, decreased level of consciousness, or prolonged seizures within 7 days after a previous dose of any pertussis vaccine (DTP, DTaP, or Tdap) Has seizures or another nervous system problem Has ever had Guillain-Barr Syndrome (also called "GBS") Has had severe pain or swelling after a previous dose of any vaccine that protects against tetanus or diphtheria In some cases, your health care provider may decide to postpone Tdapvaccination until a future visit. People with minor illnesses, such as a cold, may be vaccinated. People who are moderately or severely ill should usually wait until they recover beforegetting Tdap vaccine.  Your health care provider can give you more information. 4. Risks of a vaccine reaction Pain, redness, or swelling where the shot was given, mild fever, headache, feeling tired, and nausea, vomiting, diarrhea, or stomachache sometimes happen after Tdap vaccination. People sometimes faint after medical procedures, including vaccination. Tellyour provider if you feel dizzy or have vision changes or ringing in the ears.  As with any medicine, there is a very remote chance of a vaccine causing asevere allergic reaction, other serious injury, or death. 5. What if there is a serious problem? An allergic reaction could occur after the vaccinated person leaves the clinic. If you see signs of a severe allergic reaction (hives, swelling of the face and throat, difficulty breathing, a fast heartbeat, dizziness, or weakness), call 9-1-1and get the person to the nearest hospital. For other signs that concern you, call your health care provider.  Adverse reactions should be reported to the Vaccine Adverse Event Reporting System (VAERS). Your health care provider will usually file this report, or you can do it yourself. Visit the   VAERS website at www.vaers.hhs.gov or call 1-800-822-7967. VAERS is only for reporting reactions, and VAERS staff  members do not give medical advice. 6. The National Vaccine Injury Compensation Program The National Vaccine Injury Compensation Program (VICP) is a federal program that was created to compensate people who may have been injured by certain vaccines. Claims regarding alleged injury or death due to vaccination have a time limit for filing, which may be as short as two years. Visit the VICP website at www.hrsa.gov/vaccinecompensation or call 1-800-338-2382to learn about the program and about filing a claim. 7. How can I learn more? Ask your health care provider. Call your local or state health department. Visit the website of the Food and Drug Administration (FDA) for vaccine package inserts and additional information at www.fda.gov/vaccines-blood-biologics/vaccines. Contact the Centers for Disease Control and Prevention (CDC): Call 1-800-232-4636 (1-800-CDC-INFO) or Visit CDC's website at www.cdc.gov/vaccines. Vaccine Information Statement Tdap (Tetanus, Diphtheria, Pertussis) Vaccine(10/16/2019) This information is not intended to replace advice given to you by your health care provider. Make sure you discuss any questions you have with your healthcare provider. Document Revised: 11/11/2019 Document Reviewed: 11/11/2019 Elsevier Patient Education  2022 Elsevier Inc.  

## 2020-10-18 LAB — COMPLETE METABOLIC PANEL WITH GFR
AG Ratio: 1.9 (calc) (ref 1.0–2.5)
ALT: 25 U/L (ref 9–46)
AST: 19 U/L (ref 10–40)
Albumin: 4.5 g/dL (ref 3.6–5.1)
Alkaline phosphatase (APISO): 42 U/L (ref 36–130)
BUN: 12 mg/dL (ref 7–25)
CO2: 25 mmol/L (ref 20–32)
Calcium: 9.6 mg/dL (ref 8.6–10.3)
Chloride: 104 mmol/L (ref 98–110)
Creat: 1.13 mg/dL (ref 0.60–1.26)
Globulin: 2.4 g/dL (calc) (ref 1.9–3.7)
Glucose, Bld: 99 mg/dL (ref 65–99)
Potassium: 4.4 mmol/L (ref 3.5–5.3)
Sodium: 137 mmol/L (ref 135–146)
Total Bilirubin: 0.8 mg/dL (ref 0.2–1.2)
Total Protein: 6.9 g/dL (ref 6.1–8.1)
eGFR: 87 mL/min/{1.73_m2} (ref >=60)

## 2020-10-18 LAB — CBC WITH DIFFERENTIAL/PLATELET
Absolute Monocytes: 490 cells/uL (ref 200–950)
Basophils Absolute: 79 cells/uL (ref 0–200)
Basophils Relative: 1.1 %
Eosinophils Absolute: 202 cells/uL (ref 15–500)
Eosinophils Relative: 2.8 %
HCT: 49 % (ref 38.5–50.0)
Hemoglobin: 17.2 g/dL — ABNORMAL HIGH (ref 13.2–17.1)
Lymphs Abs: 2074 cells/uL (ref 850–3900)
MCH: 31.3 pg (ref 27.0–33.0)
MCHC: 35.1 g/dL (ref 32.0–36.0)
MCV: 89.1 fL (ref 80.0–100.0)
MPV: 9.6 fL (ref 7.5–12.5)
Monocytes Relative: 6.8 %
Neutro Abs: 4356 cells/uL (ref 1500–7800)
Neutrophils Relative %: 60.5 %
Platelets: 240 10*3/uL (ref 140–400)
RBC: 5.5 10*6/uL (ref 4.20–5.80)
RDW: 12.6 % (ref 11.0–15.0)
Total Lymphocyte: 28.8 %
WBC: 7.2 10*3/uL (ref 3.8–10.8)

## 2020-10-18 LAB — LIPID PANEL
Cholesterol: 188 mg/dL (ref <200)
HDL: 32 mg/dL — ABNORMAL LOW (ref >=40)
LDL Cholesterol (Calc): 121 mg/dL (calc) — ABNORMAL HIGH
Non-HDL Cholesterol (Calc): 156 mg/dL (calc) — ABNORMAL HIGH (ref <130)
Total CHOL/HDL Ratio: 5.9 (calc) — ABNORMAL HIGH (ref <5.0)
Triglycerides: 228 mg/dL — ABNORMAL HIGH (ref <150)

## 2020-10-18 LAB — VITAMIN D 25 HYDROXY (VIT D DEFICIENCY, FRACTURES): Vit D, 25-Hydroxy: 57 ng/mL (ref 30–100)

## 2021-03-16 NOTE — Progress Notes (Signed)
Complete Physical   Assessment and Plan: Health Maintenance- Discussed STD testing, safe sex, alcohol and drug awareness, drinking and driving dangers, wearing a seat belt and general safety measures for young adult.  Novak was seen today for annual exam and see med list.  Diagnoses and all orders for this visit:  Encounter for annual physical exam Yearly -     CBC with Differential/Platelet -     COMPLETE METABOLIC PANEL WITH GFR  Obesity Long discussion about weight loss, diet, and exercise Recommended diet heavy in fruits and veggies and low in animal meats, cheeses, and dairy products, appropriate calorie intake Follow up at next visit   Tobacco abuse Discussed cessation  Screening, ischemic heart disease -     EKG 12-Lead  Screening for thyroid disorder -     TSH  Screening for blood or protein in urine -     Urinalysis routine with reflex microscopic - Microalbumin/creatinine urine ratio  Vitamin D deficiency No supplementation at this time -     VITAMIN D 25 Hydroxy (Vit-D Deficiency, Fractures)  Hyperlipidemia Continue diet and exercise -     Lipid panel  Abnormal glucose Continue diet and exercise -     Hemoglobin A1c  Primary insomnia No longer taking Trazodone, has a 9 month old baby boy   Gastroesophageal reflux disease without esophagitis  Continue dietary modifications and TUMS  Right submandibular lymphadenopathy Ultrasound ordered to evaluate- present x 2 years  Skin lesion of right foot, bottom Appears fungal Apply Lotrisone twice a day for a week If no improvement or worsens call the office   Discussed med's effects and SE's. Screening labs and tests as requested with regular follow-up as recommended. Over 40 minutes of face to face interview,  exam, counseling, chart review and critical decision making was performed   HPI  This very nice 36 y.o.male presents for complete physical.   BP well controlled without medication BP  Readings from Last 3 Encounters:  03/20/21 110/82  10/17/20 120/78  04/12/20 128/88    BMI is Body mass index is 30.13 kg/m., he has not been working on diet and exercise.  Wt Readings from Last 3 Encounters:  03/20/21 210 lb (95.3 kg)  10/17/20 207 lb 3.2 oz (94 kg)  04/12/20 208 lb (94.3 kg)   He continues to smoke 1 ppd.  He is not ready to quit.   Reports that he has a right side lymph node that is swollen, has been present x 2 years.  Reports non-tender but can be after messing with the area. Denies fluctuation of this, reports some swallowing difficulties. Denies any coughing with eating or drinking.   2018-Dr Cabbell lumbar discectomy & fusion  He is divorced, has two children 11 & 15. He has a partner, male, sexually active, declines STD exposure or screening today.  He does not workout.  Has been talking about increasing activity with girlfriend.  Finally, patient has history of Vitamin D Deficiency, not been taking supplementation at this time and last vitamin D was  Lab Results  Component Value Date   VD25OH 57 10/17/2020  .  Currently on supplementation  Current Medications:  Current Outpatient Medications on File Prior to Visit  Medication Sig Dispense Refill   Cholecalciferol 1.25 MG (50000 UT) capsule Take one tablet by mouth three days a week for twelve weeks. (Patient not taking: Reported on 03/20/2021) 36 capsule 0   famotidine (PEPCID) 20 MG tablet Take 1 tablet (20 mg total) by  mouth as needed for heartburn or indigestion. (Patient not taking: Reported on 03/20/2021) 180 tablet 1   sildenafil (VIAGRA) 100 MG tablet Take 1 tablet (100 mg total) by mouth as needed for erectile dysfunction. (Patient not taking: Reported on 03/20/2021) 20 tablet 1   traZODone (DESYREL) 100 MG tablet Take 1 tablet (100 mg total) by mouth at bedtime. (Patient not taking: Reported on 03/20/2021) 30 tablet 0   triamcinolone ointment (KENALOG) 0.1 % Apply 1 application topically 2 (two) times  daily. (Patient not taking: Reported on 03/20/2021) 80 g 1   No current facility-administered medications on file prior to visit.   Health Maintenance:   Immunization History  Administered Date(s) Administered   PFIZER(Purple Top)SARS-COV-2 Vaccination 11/20/2019, 12/23/2019   Tdap 10/17/2020    TD/TDAP: 10/2020 Influenza: Declines Pneumovax: N/A    Sexually Active: yes STD testing offered, declined Last Dental Exam: 08/2019 Last Eye Exam: DUE  Allergies: No Known Allergies Medical History:  has Tobacco abuse and Suicidal ideation on their problem list. Surgical History:  He  has no past surgical history on file. Family History:  Hisfamily history includes CAD in his father. Social History:   reports that he has been smoking cigarettes. He has never used smokeless tobacco. He reports current alcohol use of about 2.0 standard drinks per week. No history on file for drug use.  Review of Systems: Review of Systems  Constitutional:  Negative for chills and fever.  HENT:  Negative for congestion, hearing loss, sinus pain, sore throat and tinnitus.        Right submandibular adenopathy  Eyes:  Negative for blurred vision and double vision.  Respiratory:  Negative for cough, hemoptysis, sputum production, shortness of breath and wheezing.   Cardiovascular:  Negative for chest pain, palpitations and leg swelling.  Gastrointestinal:  Negative for abdominal pain, constipation, diarrhea, heartburn, nausea and vomiting.  Genitourinary:  Negative for dysuria and urgency.  Musculoskeletal:  Negative for back pain, falls, joint pain, myalgias and neck pain.  Skin:  Positive for itching (right bottom foot, small area that is pruritic). Negative for rash.  Neurological:  Negative for dizziness, tingling, tremors, weakness and headaches.  Endo/Heme/Allergies:  Does not bruise/bleed easily.  Psychiatric/Behavioral:  Negative for depression and suicidal ideas. The patient is not nervous/anxious  and does not have insomnia.    Physical Exam: Estimated body mass index is 30.13 kg/m as calculated from the following:   Height as of this encounter: 5\' 10"  (1.778 m).   Weight as of this encounter: 210 lb (95.3 kg). BP 110/82    Pulse 71    Temp 97.7 F (36.5 C)    Ht 5\' 10"  (1.778 m)    Wt 210 lb (95.3 kg)    SpO2 98%    BMI 30.13 kg/m    General Appearance: Well nourished, in no apparent distress.  Eyes: PERRLA, EOMs, conjunctiva no swelling or erythema, normal fundi and vessels.  Sinuses: No Frontal/maxillary tenderness  ENT/Mouth: Ext aud canals clear, normal light reflex with TMs without erythema, bulging. Good dentition. No erythema, swelling, or exudate on post pharynx. Tonsils not swollen or erythematous. Hearing normal. Submandibular , right, marble in size. Neck: Supple, thyroid normal. No bruits  Respiratory: Respiratory effort normal, BS equal bilaterally without rales, rhonchi, wheezing or stridor.  Cardio: RRR without murmurs, rubs or gallops. Brisk peripheral pulses without edema.  Chest: symmetric, with normal excursions and percussion.  Abdomen: Soft, nontender, no guarding, rebound, hernias, masses, or organomegaly.  Lymphatics: Non  tender without lymphadenopathy.  Genitourinary: defer Musculoskeletal: Full ROM all peripheral extremities,5/5 strength, and normal gait.  Skin: Warm, dry . 1cm flat scaly lesion bottom of right foot, nontender Neuro: Cranial nerves intact, reflexes equal bilaterally. Normal muscle tone, no cerebellar symptoms. Sensation intact.  Psych: Awake and oriented X 3, normal affect, Insight and Judgment appropriate.    EKG: NSR, no ST changes  [facility]  03/20/2021

## 2021-03-20 ENCOUNTER — Encounter: Payer: Self-pay | Admitting: Adult Health Nurse Practitioner

## 2021-03-20 ENCOUNTER — Other Ambulatory Visit: Payer: Self-pay

## 2021-03-20 ENCOUNTER — Ambulatory Visit: Payer: 59 | Admitting: Nurse Practitioner

## 2021-03-20 ENCOUNTER — Encounter: Payer: Self-pay | Admitting: Nurse Practitioner

## 2021-03-20 VITALS — BP 110/82 | HR 71 | Temp 97.7°F | Ht 70.0 in | Wt 210.0 lb

## 2021-03-20 DIAGNOSIS — Z0001 Encounter for general adult medical examination with abnormal findings: Secondary | ICD-10-CM

## 2021-03-20 DIAGNOSIS — Z0000 Encounter for general adult medical examination without abnormal findings: Secondary | ICD-10-CM | POA: Diagnosis not present

## 2021-03-20 DIAGNOSIS — Z1389 Encounter for screening for other disorder: Secondary | ICD-10-CM

## 2021-03-20 DIAGNOSIS — E559 Vitamin D deficiency, unspecified: Secondary | ICD-10-CM

## 2021-03-20 DIAGNOSIS — Z720 Tobacco use: Secondary | ICD-10-CM

## 2021-03-20 DIAGNOSIS — I10 Essential (primary) hypertension: Secondary | ICD-10-CM

## 2021-03-20 DIAGNOSIS — E669 Obesity, unspecified: Secondary | ICD-10-CM

## 2021-03-20 DIAGNOSIS — Z136 Encounter for screening for cardiovascular disorders: Secondary | ICD-10-CM

## 2021-03-20 DIAGNOSIS — R590 Localized enlarged lymph nodes: Secondary | ICD-10-CM

## 2021-03-20 DIAGNOSIS — Z1329 Encounter for screening for other suspected endocrine disorder: Secondary | ICD-10-CM

## 2021-03-20 DIAGNOSIS — L309 Dermatitis, unspecified: Secondary | ICD-10-CM

## 2021-03-20 DIAGNOSIS — E663 Overweight: Secondary | ICD-10-CM

## 2021-03-20 DIAGNOSIS — R7309 Other abnormal glucose: Secondary | ICD-10-CM

## 2021-03-20 DIAGNOSIS — F5101 Primary insomnia: Secondary | ICD-10-CM

## 2021-03-20 DIAGNOSIS — L989 Disorder of the skin and subcutaneous tissue, unspecified: Secondary | ICD-10-CM

## 2021-03-20 DIAGNOSIS — N529 Male erectile dysfunction, unspecified: Secondary | ICD-10-CM

## 2021-03-20 DIAGNOSIS — E785 Hyperlipidemia, unspecified: Secondary | ICD-10-CM

## 2021-03-20 DIAGNOSIS — K219 Gastro-esophageal reflux disease without esophagitis: Secondary | ICD-10-CM

## 2021-03-20 MED ORDER — CLOTRIMAZOLE-BETAMETHASONE 1-0.05 % EX CREA: TOPICAL_CREAM | CUTANEOUS | 1 refills | Status: DC

## 2021-03-20 NOTE — Patient Instructions (Signed)
YOU CAN CALL TO MAKE AN ULTRASOUND..  I have put in an order for an ultrasound for you to have You can set them up at your convenience by calling this number [phone] You will likely have the ultrasound at [facility] [facility]  If you have any issues call our office and we will set this up for you.    GENERAL HEALTH GOALS   Know what a healthy weight is for you (roughly BMI <25) and aim to maintain this   Aim for 7+ servings of fruits and vegetables daily   70-80+ fluid ounces of water or unsweet tea for healthy kidneys   Limit to max 1 drink of alcohol per day; avoid smoking/tobacco   Limit animal fats in diet for cholesterol and heart health - choose grass fed whenever available   Avoid highly processed foods, and foods high in saturated/trans fats   Aim for low stress - take time to unwind and care for your mental health   Aim for 150 min of moderate intensity exercise weekly for heart health, and weights twice weekly for bone health   Aim for 7-9 hours of sleep daily

## 2021-03-21 LAB — COMPLETE METABOLIC PANEL WITH GFR
AG Ratio: 2.3 (calc) (ref 1.0–2.5)
ALT: 33 U/L (ref 9–46)
AST: 24 U/L (ref 10–40)
Albumin: 4.8 g/dL (ref 3.6–5.1)
Alkaline phosphatase (APISO): 43 U/L (ref 36–130)
BUN: 9 mg/dL (ref 7–25)
CO2: 26 mmol/L (ref 20–32)
Calcium: 9.9 mg/dL (ref 8.6–10.3)
Chloride: 106 mmol/L (ref 98–110)
Creat: 1.12 mg/dL (ref 0.60–1.26)
Globulin: 2.1 g/dL (calc) (ref 1.9–3.7)
Glucose, Bld: 85 mg/dL (ref 65–99)
Potassium: 4.4 mmol/L (ref 3.5–5.3)
Sodium: 143 mmol/L (ref 135–146)
Total Bilirubin: 0.6 mg/dL (ref 0.2–1.2)
Total Protein: 6.9 g/dL (ref 6.1–8.1)
eGFR: 87 mL/min/{1.73_m2} (ref >=60)

## 2021-03-21 LAB — URINALYSIS, ROUTINE W REFLEX MICROSCOPIC
Bilirubin Urine: NEGATIVE
Glucose, UA: NEGATIVE
Hgb urine dipstick: NEGATIVE
Ketones, ur: NEGATIVE
Leukocytes,Ua: NEGATIVE
Nitrite: NEGATIVE
Protein, ur: NEGATIVE
Specific Gravity, Urine: 1.006 (ref 1.001–1.035)
pH: 6.5 (ref 5.0–8.0)

## 2021-03-21 LAB — LIPID PANEL
Cholesterol: 176 mg/dL (ref <200)
HDL: 37 mg/dL — ABNORMAL LOW (ref >=40)
LDL Cholesterol (Calc): 110 mg/dL (calc) — ABNORMAL HIGH
Non-HDL Cholesterol (Calc): 139 mg/dL (calc) — ABNORMAL HIGH (ref <130)
Total CHOL/HDL Ratio: 4.8 (calc) (ref <5.0)
Triglycerides: 173 mg/dL — ABNORMAL HIGH (ref <150)

## 2021-03-21 LAB — CBC WITH DIFFERENTIAL/PLATELET
Absolute Monocytes: 358 cells/uL (ref 200–950)
Basophils Absolute: 73 cells/uL (ref 0–200)
Basophils Relative: 1.3 %
Eosinophils Absolute: 118 cells/uL (ref 15–500)
Eosinophils Relative: 2.1 %
HCT: 48.3 % (ref 38.5–50.0)
Hemoglobin: 16.6 g/dL (ref 13.2–17.1)
Lymphs Abs: 1910 cells/uL (ref 850–3900)
MCH: 30.9 pg (ref 27.0–33.0)
MCHC: 34.4 g/dL (ref 32.0–36.0)
MCV: 89.9 fL (ref 80.0–100.0)
MPV: 9.7 fL (ref 7.5–12.5)
Monocytes Relative: 6.4 %
Neutro Abs: 3142 cells/uL (ref 1500–7800)
Neutrophils Relative %: 56.1 %
Platelets: 246 10*3/uL (ref 140–400)
RBC: 5.37 10*6/uL (ref 4.20–5.80)
RDW: 12.5 % (ref 11.0–15.0)
Total Lymphocyte: 34.1 %
WBC: 5.6 10*3/uL (ref 3.8–10.8)

## 2021-03-21 LAB — VITAMIN D 25 HYDROXY (VIT D DEFICIENCY, FRACTURES): Vit D, 25-Hydroxy: 45 ng/mL (ref 30–100)

## 2021-03-21 LAB — MAGNESIUM: Magnesium: 2.1 mg/dL (ref 1.5–2.5)

## 2021-03-21 LAB — MICROALBUMIN / CREATININE URINE RATIO
Creatinine, Urine: 36 mg/dL (ref 20–320)
Microalb, Ur: <0.2 mg/dL

## 2021-03-21 LAB — HEMOGLOBIN A1C
Hgb A1c MFr Bld: 5.2 % of total Hgb (ref <5.7)
Mean Plasma Glucose: 103 mg/dL
eAG (mmol/L): 5.7 mmol/L

## 2021-03-21 LAB — TSH: TSH: 1.1 mIU/L (ref 0.40–4.50)

## 2021-03-29 ENCOUNTER — Ambulatory Visit
Admission: RE | Admit: 2021-03-29 | Discharge: 2021-03-29 | Disposition: A | Discharge status: Home / Self Care | Payer: 59 | Source: Ambulatory Visit | Attending: Nurse Practitioner | Admitting: Nurse Practitioner

## 2021-03-29 ENCOUNTER — Other Ambulatory Visit: Payer: Self-pay | Admitting: Nurse Practitioner

## 2021-03-29 DIAGNOSIS — R590 Localized enlarged lymph nodes: Secondary | ICD-10-CM

## 2021-03-29 DIAGNOSIS — R221 Localized swelling, mass and lump, neck: Secondary | ICD-10-CM

## 2021-04-19 ENCOUNTER — Other Ambulatory Visit: Payer: Self-pay

## 2021-04-19 ENCOUNTER — Ambulatory Visit
Admission: RE | Admit: 2021-04-19 | Discharge: 2021-04-19 | Disposition: A | Discharge status: Home / Self Care | Payer: 59 | Source: Ambulatory Visit | Attending: Nurse Practitioner | Admitting: Nurse Practitioner

## 2021-04-19 DIAGNOSIS — R221 Localized swelling, mass and lump, neck: Secondary | ICD-10-CM

## 2021-04-19 MED ORDER — IOPAMIDOL (ISOVUE-300) INJECTION 61%
100.0000 mL | Freq: Once | INTRAVENOUS | Status: AC | PRN
  Administered 2021-04-19: 100 mL via INTRAVENOUS

## 2021-04-20 ENCOUNTER — Other Ambulatory Visit: Payer: Self-pay | Admitting: Nurse Practitioner

## 2021-04-20 DIAGNOSIS — R221 Localized swelling, mass and lump, neck: Secondary | ICD-10-CM

## 2021-05-10 ENCOUNTER — Other Ambulatory Visit (HOSPITAL_COMMUNITY): Payer: Self-pay | Admitting: Otolaryngology

## 2021-05-10 ENCOUNTER — Encounter (HOSPITAL_COMMUNITY): Payer: Self-pay | Admitting: Radiology

## 2021-05-10 DIAGNOSIS — R221 Localized swelling, mass and lump, neck: Secondary | ICD-10-CM

## 2021-05-10 NOTE — Progress Notes (Signed)
Patient Name  ?[name] Legal Sex  ?Male DOB  ?10/12/1985 SSN  ?[id number] Address  ?[address]  ?[place] [address] Phone  ?[phone] (Home)  ? ? RE: [place] CORE BIOPSY (LYMPH NODES) ?Received: Today ?Suttle, Rosanne Ashing, MD  Garth Bigness D ?Approved for ultrasound guided right submandibular gland mass biopsy.  ? ?[name]   ?  ?   ?Previous Messages ?  ?----- Message -----  ?From: Garth Bigness D  ?Sent: 05/10/2021  12:38 PM EST  ?To: [facility] Procedure Requests  ?Subject: [place] CORE BIOPSY (LYMPH NODES)                  ? ?Procedure:   [place] CORE BIOPSY (LYMPH NODES)  ? ?Reason:  Neck Mass  ? ?History:  CT, [place] in computer  ? ?Provider:  Izora Gala  ? ?Provider Contact:  [phone]  ?

## 2021-05-15 ENCOUNTER — Other Ambulatory Visit: Payer: Self-pay | Admitting: Radiology

## 2021-05-16 ENCOUNTER — Encounter (HOSPITAL_COMMUNITY): Payer: Self-pay

## 2021-05-16 ENCOUNTER — Other Ambulatory Visit (HOSPITAL_COMMUNITY): Payer: Self-pay | Admitting: Otolaryngology

## 2021-05-16 ENCOUNTER — Other Ambulatory Visit: Payer: Self-pay

## 2021-05-16 ENCOUNTER — Ambulatory Visit (HOSPITAL_COMMUNITY)
Admission: RE | Admit: 2021-05-16 | Discharge: 2021-05-16 | Disposition: A | Discharge status: Home / Self Care | Payer: 59 | Source: Ambulatory Visit | Attending: Otolaryngology | Admitting: Otolaryngology

## 2021-05-16 DIAGNOSIS — F17200 Nicotine dependence, unspecified, uncomplicated: Secondary | ICD-10-CM | POA: Insufficient documentation

## 2021-05-16 DIAGNOSIS — R221 Localized swelling, mass and lump, neck: Secondary | ICD-10-CM | POA: Diagnosis not present

## 2021-05-16 LAB — CBC
HCT: 47.7 % (ref 39.0–52.0)
Hemoglobin: 17.3 g/dL — ABNORMAL HIGH (ref 13.0–17.0)
MCH: 31.8 pg (ref 26.0–34.0)
MCHC: 36.3 g/dL — ABNORMAL HIGH (ref 30.0–36.0)
MCV: 87.7 fL (ref 80.0–100.0)
Platelets: 236 10*3/uL (ref 150–400)
RBC: 5.44 MIL/uL (ref 4.22–5.81)
RDW: 12 % (ref 11.5–15.5)
WBC: 12.6 10*3/uL — ABNORMAL HIGH (ref 4.0–10.5)
nRBC: 0 % (ref 0.0–0.2)

## 2021-05-16 LAB — PROTIME-INR
INR: 1 (ref 0.8–1.2)
Prothrombin Time: 12.9 seconds (ref 11.4–15.2)

## 2021-05-16 MED ORDER — LIDOCAINE HCL (PF) 1 % IJ SOLN
INTRAMUSCULAR | Status: AC
  Filled 2021-05-16: qty 30

## 2021-05-16 MED ORDER — FENTANYL CITRATE (PF) 100 MCG/2ML IJ SOLN
INTRAMUSCULAR | Status: AC
  Filled 2021-05-16: qty 2

## 2021-05-16 MED ORDER — FENTANYL CITRATE (PF) 100 MCG/2ML IJ SOLN
INTRAMUSCULAR | Status: AC | PRN
  Administered 2021-05-16: 50 ug via INTRAVENOUS

## 2021-05-16 MED ORDER — MIDAZOLAM HCL 2 MG/2ML IJ SOLN
INTRAMUSCULAR | Status: AC | PRN
  Administered 2021-05-16 (×2): 1 mg via INTRAVENOUS

## 2021-05-16 MED ORDER — MIDAZOLAM HCL 2 MG/2ML IJ SOLN
INTRAMUSCULAR | Status: AC
  Filled 2021-05-16: qty 2

## 2021-05-16 MED ORDER — SODIUM CHLORIDE 0.9 % IV SOLN: INTRAVENOUS | Status: DC

## 2021-05-16 NOTE — Procedures (Signed)
Interventional Radiology Procedure Note ? ?Procedure: US Guided Biopsy of right submandibular gland mass ? ?Complications: None ? ?Estimated Blood Loss: < 10 mL ? ?Findings: ?81 G core biopsy of 1.5 cm right submandibular gland mass performed under US guidance.  Three core samples obtained and sent to Pathology. ? ?[name], M.D ?Pager:  [phone] ? ?  ?

## 2021-05-16 NOTE — H&P (Signed)
Chief Complaint: Submandibular mass. Request is for right submandibular gland biopsy  Referring Physician(s): Rosen,Jefry  Supervising Physician: Aletta Edouard  Patient Status: [facility] - Out-pt  History of Present Illness: [name] is a 36 y.o. male 36 y.o. male outpatient. Smoker. History of alcohol use. Found to have a submandibular mass. CT soft tissues from 2.8.23 reads Approximately 1.5 cm slightly hypodense soft tissue mass within the right submandibular gland is subtle by CT but corresponds to the marked area of palpable concern and the Ultrasound abnormality last month. 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 There is no regional lymphadenopathy. Team is requesting a biopsy of the of the right submandibular gland for further evaluation of possible malignancy  Currently without any significant complaints. Patient alert and laying in bed, calm and comfortable. Denies any fevers, headache, chest pain, SOB, cough, abdominal pain, nausea, vomiting or bleeding. Return precautions and treatment recommendations and follow-up discussed with the patient who is agreeable with the plan.    Past Medical History:  Diagnosis Date   Lumbar herniated disc 2005   L5-S1    History reviewed. No pertinent surgical history.  Allergies: Patient has no known allergies.  Medications: Prior to Admission medications   Medication Sig Start Date End Date Taking? Authorizing Provider  acetaminophen (TYLENOL) 500 MG tablet Take 1,000 mg by mouth every 6 (six) hours as needed for moderate pain or headache.   Yes [provider]  clotrimazole-betamethasone (LOTRISONE) cream Apply to affected area 2 times daily Patient taking differently: Apply 1 application. topically 2 (two) times daily as needed (rash). 03/20/21 03/20/22 Yes Magda Bernheim, NP     Family History   Problem Relation Age of Onset   CAD Father     Social History   Socioeconomic History   Marital status: Single    Spouse name: Not on file   Number of children: Not on file   Years of education: Not on file   Highest education level: Not on file  Occupational History   Not on file  Tobacco Use   Smoking status: Every Day    Types: Cigarettes   Smokeless tobacco: Never  Vaping Use   Vaping Use: Never used  Substance and Sexual Activity   Alcohol use: Yes    Alcohol/week: 2.0 standard drinks    Types: 2 Cans of beer per week   Drug use: Never   Sexual activity: Not on file  Other Topics Concern   Not on file  Social History Narrative   Not on file   Social Determinants of Health   Financial Resource Strain: Not on file  Food Insecurity: Not on file  Transportation Needs: Not on file  Physical Activity: Not on file  Stress: Not on file  Social Connections: Not on file    Review of Systems: A 12 point ROS discussed and pertinent positives are indicated in the HPI above.  All other systems are negative.  Review of Systems  Constitutional:  Negative for fever.  HENT:  Negative for congestion.   Respiratory:  Negative for cough and shortness of breath.   Cardiovascular:  Negative for chest pain.  Gastrointestinal:  Negative for abdominal pain.  Neurological:  Negative for headaches.  Psychiatric/Behavioral:  Negative for behavioral problems and confusion.    Vital Signs: BP 134/89    Pulse 69    Temp 97.8 F (  36.6 C) (Oral)    Resp 16    Ht '5\' 9"'$  (1.753 m)    Wt 206 lb (93.4 kg)    SpO2 99%    BMI 30.42 kg/m   Physical Exam Vitals and nursing note reviewed.  Constitutional:      Appearance: He is well-developed.  HENT:     Head: Normocephalic.  Cardiovascular:     Rate and Rhythm: Normal rate and regular rhythm.     Heart sounds: Normal heart sounds.  Pulmonary:     Effort: Pulmonary effort is normal.     Breath sounds: Normal breath sounds.   Musculoskeletal:        General: Swelling (right side of neck) present. Normal range of motion.     Cervical back: Normal range of motion.  Skin:    General: Skin is dry.  Neurological:     Mental Status: He is alert and oriented to person, place, and time.    Imaging: CT Soft Tissue Neck W Contrast  Result Date: 04/20/2021 CLINICAL DATA:  36 year old male with non pulsatile right submandibular region mass, 1.4 cm and hypoechoic on ultrasound last month. EXAM: CT NECK WITH CONTRAST TECHNIQUE: Multidetector CT imaging of the neck was performed using the standard protocol following the bolus administration of intravenous contrast. RADIATION DOSE REDUCTION: This exam was performed according to the departmental dose-optimization program which includes automated exposure control, adjustment of the mA and/or kV according to patient size and/or use of iterative reconstruction technique. CONTRAST:  165m ISOVUE-300 IOPAMIDOL (ISOVUE-300) INJECTION 61% COMPARISON:  Face CT 04/26/2009. Neck ultrasound 03/29/2021. FINDINGS: Pharynx and larynx: Larynx and pharynx soft tissue contours are within normal limits. Parapharyngeal and retropharyngeal spaces appear normal. Salivary glands: Negative sublingual space. Parotid glands appear symmetric and within normal limits. The left submandibular gland appears normal except for possible sialolithiasis, around 4 mm calculus along the left lateral margin of the gland which might alternatively be associated with a vessel there. Subtle round slightly hypodense mass of the posteroinferior right submandibular gland (series 6, image 35) appears to correspond to the recent ultrasound finding and is about 1.5 cm diameter. This is difficult to delineate by CT (coronal image 36 and axial image 70) but corresponds to the marked area of clinical concern. No regional inflammation or lymphadenopathy. The remainder of the right submandibular gland does not appear inflamed or obstructed.  Thyroid: Negative. Lymph nodes: Negative. No cervical lymphadenopathy. Right level 1 and level 2 lymph nodes are within normal limits. Vascular: Major vascular structures in the neck and at the skull base appear to be patent, with no atherosclerosis identified. Limited intracranial: Negative. Visualized orbits: Negative. 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 But the left maxillary sinus is well aerated. There is a small right maxillary alveolar recess mucous retention cysts. There is mild to moderate bilateral ethmoid sinus mucosal thickening. Tympanic cavities and mastoids are clear. Skeleton: Posttraumatic and postoperative changes to the left maxilla and lateral orbit as above. No acute or suspicious osseous lesion identified. Upper chest: Negative. IMPRESSION: 1. Approximately 1.5 cm slightly hypodense soft tissue mass within the right submandibular gland is subtle by CT but corresponds to the marked area of palpable concern and the Ultrasound abnormality last month. 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 There is no  regional lymphadenopathy. 2. No other acute finding in the Neck. Chronic posttraumatic and postoperative changes to the left maxilla and lateral orbit. These results will be called to the ordering clinician or representative by the Radiologist Assistant, and communication documented in the PACS or [employer]. Electronically Signed   By: Genevie Ann M.D.   On: 04/20/2021 10:19    Labs:  CBC: Recent Labs    10/17/20 0906 03/20/21 1046  WBC 7.2 5.6  HGB 17.2* 16.6  HCT 49.0 48.3  PLT 240 246    COAGS: No results for input(s): INR, APTT in the last 8760 hours.  BMP: Recent Labs    10/17/20 0906 03/20/21 1046  NA 137 143  K 4.4 4.4   CL 104 106  CO2 25 26  GLUCOSE 99 85  BUN 12 9  CALCIUM 9.6 9.9  CREATININE 1.13 1.12    LIVER FUNCTION TESTS: Recent Labs    10/17/20 0906 03/20/21 1046  BILITOT 0.8 0.6  AST 19 24  ALT 25 33  PROT 6.9 6.9      Assessment and Plan:  36 y.o. male outpatient. Smoker. History of alcohol use. Found to have a submandibular mass. CT soft tissues from 2.8.23 reads Approximately 1.5 cm slightly hypodense soft tissue mass within the right submandibular gland is subtle by CT but corresponds to the marked area of palpable concern and the Ultrasound abnormality last month. 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 There is no regional lymphadenopathy. Team is requesting a biopsy of the of the right submandibular gland for further evaluation of possible malignancy  No recent labs. All medications are within acceptable parameters. NKDA. Patient has been NPO since midnight.   Risks and benefits of right submandibular gland biopsy was discussed with the patient and/or patient's family including, but not limited to bleeding, infection, damage to adjacent structures or low yield requiring additional tests.  All of the questions were answered and there is agreement to proceed.  Consent signed and in chart.   Thank you for this interesting consult.  I greatly enjoyed meeting [facility] and look forward to participating in their care.  A copy of this report was sent to the requesting provider on this date.  Electronically Signed: Jacqualine Mau, NP 05/16/2021, 6:54 AM   I spent a total of  30 Minutes   in face to face in clinical consultation, greater than 50% of which was counseling/coordinating care for right submandibular gland biopsy

## 2021-05-18 LAB — SURGICAL PATHOLOGY

## 2021-09-27 NOTE — Progress Notes (Signed)
6 MONTH FOLLOW UP   Assessment and Plan:  Overweight Long discussion about weight loss, diet, and exercise Recommended diet heavy in fruits and veggies and low in animal meats, cheeses, and dairy products, appropriate calorie intake Follow up at next visit  - TSH  Tobacco abuse Discussed cessation Not currently ready to quit  Vitamin D deficiency No supplementation at this time   Hyperlipidemia Continue diet and exercise -     Lipid panel, CBC, CMP  Abnormal glucose Continue diet and exercise - CMP  Primary insomnia No longer taking Trazodone, has a 41 month old baby boy   Encounter for screening for COVID-19 -     POC COVID-19  Bronchitis Encourage fluids Mucinex as needed If symptoms worsen or do not resolve in the next 5 days notify the office -     albuterol (VENTOLIN HFA) 108 (90 Base) MCG/ACT inhaler; Inhale 2 puffs into the lungs every 6 (six) hours as needed for wheezing or shortness of breath. -     azithromycin (ZITHROMAX) 250 MG tablet; Take 2 tablets (500 mg) on  Day 1,  followed by 1 tablet (250 mg) once daily on Days 2 through 5. -     benzonatate (TESSALON PERLES) 100 MG capsule; Take 1 capsule (100 mg total) by mouth every 6 (six) hours as needed for cough. -     dexamethasone (DECADRON) 4 MG tablet; Take 3 tabs for 3 days, 2 tabs for 3 days 1 tab for 5 days. Take with food. -     promethazine-dextromethorphan (PROMETHAZINE-DM) 6.25-15 MG/5ML syrup; Take 5 mLs by mouth 4 (four) times daily as needed for cough.    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 Screening labs and tests as requested with regular follow-up as recommended. Over 40 minutes of face to face interview,  exam, counseling, chart review and critical decision making was  performed   HPI  This very nice 36 y.o.male presents for 6 month follow up has Tobacco abuse; Obesity (BMI 30-39.9); Hyperlipidemia; Abnormal glucose; and Gastroesophageal reflux disease without esophagitis on their problem list.   He is having cold symptoms of productive cough of dark green mucus.  Clear nasal mucus. Post nasal drip.  Headaches, body aches.  Denies fever, nausea, vomiting.  Symptoms have been present for 5 days.  Bp is mildly elevated.  BP Readings from Last 3 Encounters:  09/28/21 (!) 136/98  05/16/21 (!) 137/100  03/20/21 110/82    BMI is Body mass index is 28.32 kg/m., he has not been working on diet and exercise.Has lost 9 pounds since last visit Wt Readings from Last 3 Encounters:  09/28/21 197 lb 6.4 oz (89.5 kg)  05/16/21 206 lb (93.4 kg)  03/20/21 210 lb (95.3 kg)   He continues to smoke 1 ppd.  He is not ready to quit.   Reports that he has a right side lymph node that is swollen, has been present x 2 years.  Reports non-tender but can be after messing with the area. Denies fluctuation of this, reports some swallowing difficulties. Denies any coughing with eating or drinking.   2018-Dr Cabbell lumbar discectomy & fusion  He is divorced, has two children 11 & 15. He has a partner, male, sexually active, declines STD exposure or screening today.  He does not workout.  Has been talking  about increasing activity with girlfriend.  Finally, patient has history of Vitamin D Deficiency, not been taking supplementation at this time and last vitamin D was  Lab Results  Component Value Date   VD25OH 45 03/20/2021  .  Currently on supplementation  Current Medications:  Current Outpatient Medications on File Prior to Visit  Medication Sig Dispense Refill   hydroxychloroquine (PLAQUENIL) 200 MG tablet Take by mouth 2 (two) times daily.     acetaminophen (TYLENOL) 500 MG tablet Take 1,000 mg by mouth every 6 (six) hours as needed for moderate pain or headache.  (Patient not taking: Reported on 09/28/2021)     clotrimazole-betamethasone (LOTRISONE) cream Apply to affected area 2 times daily (Patient not taking: Reported on 09/28/2021) 15 g 1   No current facility-administered medications on file prior to visit.   Health Maintenance:   Immunization History  Administered Date(s) Administered   PFIZER(Purple Top)SARS-COV-2 Vaccination 11/20/2019, 12/23/2019   Tdap 10/17/2020    TD/TDAP: 10/2020 Influenza: Declines Pneumovax: N/A    Sexually Active: yes STD testing offered, declined Last Dental Exam: 08/2019 Last Eye Exam: DUE  Allergies: No Known Allergies Medical History:  has Tobacco abuse and Suicidal ideation on their problem list. Surgical History:  He  has no past surgical history on file. Family History:  Hisfamily history includes CAD in his father. Social History:   reports that he has been smoking cigarettes. He has never used smokeless tobacco. He reports current alcohol use of about 2.0 standard drinks of alcohol per week. He reports that he does not use drugs.  Review of Systems: Review of Systems  Constitutional:  Negative for chills and fever.  HENT:  Negative for congestion, hearing loss, sinus pain, sore throat and tinnitus.        Right submandibular adenopathy  Eyes:  Negative for blurred vision and double vision.  Respiratory:  Negative for cough, hemoptysis, sputum production, shortness of breath and wheezing.   Cardiovascular:  Negative for chest pain, palpitations and leg swelling.  Gastrointestinal:  Negative for abdominal pain, constipation, diarrhea, heartburn, nausea and vomiting.  Genitourinary:  Negative for dysuria and urgency.  Musculoskeletal:  Negative for back pain, falls, joint pain, myalgias and neck pain.  Skin:  Positive for itching (right bottom foot, small area that is pruritic). Negative for rash.  Neurological:  Negative for dizziness, tingling, tremors, weakness and headaches.   Endo/Heme/Allergies:  Does not bruise/bleed easily.  Psychiatric/Behavioral:  Negative for depression and suicidal ideas. The patient is not nervous/anxious and does not have insomnia.     Physical Exam: Estimated body mass index is 28.32 kg/m as calculated from the following:   Height as of this encounter: '5\' 10"'$  (1.778 m).   Weight as of this encounter: 197 lb 6.4 oz (89.5 kg). BP (!) 136/98   Pulse 80   Temp (!) 97.1 F (36.2 C)   Ht '5\' 10"'$  (1.778 m)   Wt 197 lb 6.4 oz (89.5 kg)   SpO2 97%   BMI 28.32 kg/m    General Appearance: Well nourished, in no apparent distress.  Eyes: PERRLA, EOMs, conjunctiva no swelling or erythema, normal fundi and vessels.  Sinuses: No Frontal/maxillary tenderness  ENT/Mouth: Ext aud canals clear, normal light reflex with TMs without erythema, bulging. Good dentition. No erythema, swelling, or exudate on post pharynx. Tonsils not swollen or erythematous. Hearing normal. Submandibular , right, marble in size. Neck: Supple, thyroid normal. No bruits  Respiratory: Respiratory effort normal, BS equal bilaterally Wheezes noted in  upper lobes bilaterally Cardio: RRR without murmurs, rubs or gallops. Brisk peripheral pulses without edema.  Chest: symmetric, with normal excursions and percussion.  Abdomen: Soft, nontender, no guarding, rebound, hernias, masses, or organomegaly.  Lymphatics: Non tender without lymphadenopathy.  Genitourinary: defer Musculoskeletal: Full ROM all peripheral extremities,5/5 strength, and normal gait.  Skin: Warm, dry . 1cm flat scaly lesion bottom of right foot, nontender Neuro: Cranial nerves intact, reflexes equal bilaterally. Normal muscle tone, no cerebellar symptoms. Sensation intact.  Psych: Awake and oriented X 3, normal affect, Insight and Judgment appropriate.      Magda Bernheim ANP-C  [name]  09/28/2021

## 2021-09-28 ENCOUNTER — Ambulatory Visit: Payer: 59 | Admitting: Nurse Practitioner

## 2021-09-28 ENCOUNTER — Encounter: Payer: Self-pay | Admitting: Nurse Practitioner

## 2021-09-28 VITALS — BP 136/98 | HR 80 | Temp 97.1°F | Ht 70.0 in | Wt 197.4 lb

## 2021-09-28 DIAGNOSIS — E669 Obesity, unspecified: Secondary | ICD-10-CM

## 2021-09-28 DIAGNOSIS — R7309 Other abnormal glucose: Secondary | ICD-10-CM | POA: Diagnosis not present

## 2021-09-28 DIAGNOSIS — E785 Hyperlipidemia, unspecified: Secondary | ICD-10-CM

## 2021-09-28 DIAGNOSIS — K219 Gastro-esophageal reflux disease without esophagitis: Secondary | ICD-10-CM

## 2021-09-28 DIAGNOSIS — F5101 Primary insomnia: Secondary | ICD-10-CM

## 2021-09-28 DIAGNOSIS — E559 Vitamin D deficiency, unspecified: Secondary | ICD-10-CM

## 2021-09-28 DIAGNOSIS — Z1152 Encounter for screening for COVID-19: Secondary | ICD-10-CM | POA: Diagnosis not present

## 2021-09-28 DIAGNOSIS — Z720 Tobacco use: Secondary | ICD-10-CM

## 2021-09-28 DIAGNOSIS — D582 Other hemoglobinopathies: Secondary | ICD-10-CM

## 2021-09-28 DIAGNOSIS — D119 Benign neoplasm of major salivary gland, unspecified: Secondary | ICD-10-CM

## 2021-09-28 DIAGNOSIS — J40 Bronchitis, not specified as acute or chronic: Secondary | ICD-10-CM

## 2021-09-28 DIAGNOSIS — Z79899 Other long term (current) drug therapy: Secondary | ICD-10-CM

## 2021-09-28 LAB — POC COVID19 BINAXNOW: SARS Coronavirus 2 Ag: NEGATIVE

## 2021-09-28 MED ORDER — BENZONATATE 100 MG PO CAPS: 100.0000 mg | ORAL_CAPSULE | Freq: Four times a day (QID) | ORAL | 1 refills | Status: DC | PRN

## 2021-09-28 MED ORDER — DEXAMETHASONE 4 MG PO TABS: ORAL_TABLET | ORAL | 0 refills | Status: DC

## 2021-09-28 MED ORDER — PROMETHAZINE-DM 6.25-15 MG/5ML PO SYRP: 5.0000 mL | ORAL_SOLUTION | Freq: Four times a day (QID) | ORAL | 1 refills | Status: AC | PRN

## 2021-09-28 MED ORDER — BENZONATATE 100 MG PO CAPS
100.0000 mg | ORAL_CAPSULE | Freq: Four times a day (QID) | ORAL | 1 refills | Status: AC | PRN
Start: 2021-09-28 — End: 2022-09-28

## 2021-09-28 MED ORDER — AZITHROMYCIN 250 MG PO TABS: ORAL_TABLET | ORAL | 1 refills | Status: DC

## 2021-09-28 MED ORDER — ALBUTEROL SULFATE HFA 108 (90 BASE) MCG/ACT IN AERS: 2.0000 | INHALATION_SPRAY | Freq: Four times a day (QID) | RESPIRATORY_TRACT | 2 refills | Status: AC | PRN

## 2021-09-28 MED ORDER — AZITHROMYCIN 250 MG PO TABS: ORAL_TABLET | ORAL | 1 refills | Status: AC

## 2021-09-28 MED ORDER — PROMETHAZINE-DM 6.25-15 MG/5ML PO SYRP: 5.0000 mL | ORAL_SOLUTION | Freq: Four times a day (QID) | ORAL | 1 refills | Status: DC | PRN

## 2021-09-28 MED ORDER — DEXAMETHASONE 4 MG PO TABS: ORAL_TABLET | ORAL | 0 refills | Status: AC

## 2021-09-28 MED ORDER — ALBUTEROL SULFATE HFA 108 (90 BASE) MCG/ACT IN AERS: 2.0000 | INHALATION_SPRAY | Freq: Four times a day (QID) | RESPIRATORY_TRACT | 2 refills | Status: DC | PRN

## 2021-09-29 LAB — COMPLETE METABOLIC PANEL WITH GFR
AG Ratio: 1.9 (calc) (ref 1.0–2.5)
ALT: 19 U/L (ref 9–46)
AST: 17 U/L (ref 10–40)
Albumin: 5 g/dL (ref 3.6–5.1)
Alkaline phosphatase (APISO): 53 U/L (ref 36–130)
BUN: 9 mg/dL (ref 7–25)
CO2: 26 mmol/L (ref 20–32)
Calcium: 9.9 mg/dL (ref 8.6–10.3)
Chloride: 104 mmol/L (ref 98–110)
Creat: 0.99 mg/dL (ref 0.60–1.26)
Globulin: 2.7 g/dL (calc) (ref 1.9–3.7)
Glucose, Bld: 85 mg/dL (ref 65–99)
Potassium: 4.4 mmol/L (ref 3.5–5.3)
Sodium: 139 mmol/L (ref 135–146)
Total Bilirubin: 0.8 mg/dL (ref 0.2–1.2)
Total Protein: 7.7 g/dL (ref 6.1–8.1)
eGFR: 101 mL/min/{1.73_m2} (ref >=60)

## 2021-09-29 LAB — CBC WITH DIFFERENTIAL/PLATELET
Absolute Monocytes: 566 cells/uL (ref 200–950)
Basophils Absolute: 61 cells/uL (ref 0–200)
Basophils Relative: 0.6 %
Eosinophils Absolute: 71 cells/uL (ref 15–500)
Eosinophils Relative: 0.7 %
HCT: 49.3 % (ref 38.5–50.0)
Hemoglobin: 17.2 g/dL — ABNORMAL HIGH (ref 13.2–17.1)
Lymphs Abs: 1586 cells/uL (ref 850–3900)
MCH: 31.3 pg (ref 27.0–33.0)
MCHC: 34.9 g/dL (ref 32.0–36.0)
MCV: 89.8 fL (ref 80.0–100.0)
MPV: 9.4 fL (ref 7.5–12.5)
Monocytes Relative: 5.6 %
Neutro Abs: 7817 cells/uL — ABNORMAL HIGH (ref 1500–7800)
Neutrophils Relative %: 77.4 %
Platelets: 244 10*3/uL (ref 140–400)
RBC: 5.49 10*6/uL (ref 4.20–5.80)
RDW: 12.2 % (ref 11.0–15.0)
Total Lymphocyte: 15.7 %
WBC: 10.1 10*3/uL (ref 3.8–10.8)

## 2021-09-29 LAB — LIPID PANEL
Cholesterol: 177 mg/dL (ref <200)
HDL: 35 mg/dL — ABNORMAL LOW (ref >=40)
LDL Cholesterol (Calc): 113 mg/dL (calc) — ABNORMAL HIGH
Non-HDL Cholesterol (Calc): 142 mg/dL (calc) — ABNORMAL HIGH (ref <130)
Total CHOL/HDL Ratio: 5.1 (calc) — ABNORMAL HIGH (ref <5.0)
Triglycerides: 172 mg/dL — ABNORMAL HIGH (ref <150)

## 2021-09-29 LAB — TSH: TSH: 1.02 mIU/L (ref 0.40–4.50)

## 2022-02-19 IMAGING — US US SOFT TISSUE HEAD/NECK
1 series · 12 of 12 positions shown · non-contrast
Comparison: None.

CLINICAL DATA: Right submandibular palpable mass

EXAM:
ULTRASOUND OF HEAD/NECK SOFT TISSUES
TECHNIQUE: Ultrasound examination of the head and neck soft tissues was
performed in the area of clinical concern.

[Series 1: us soft tissue head/neck · 0.06mm/px · 12 acquisitions, 12 frames shown]
[im 1/12]
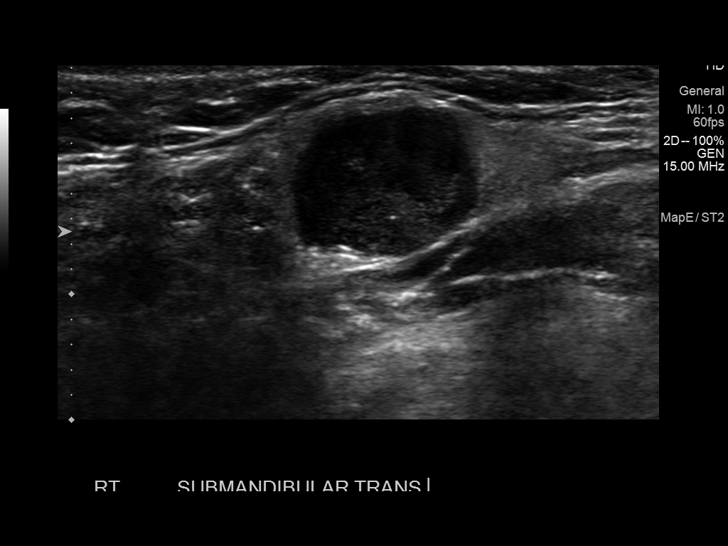
[im 2/12]
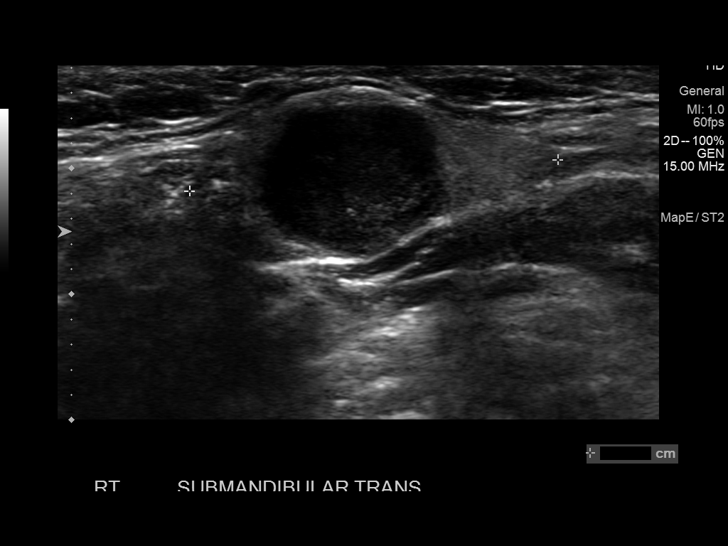
[im 3/12]
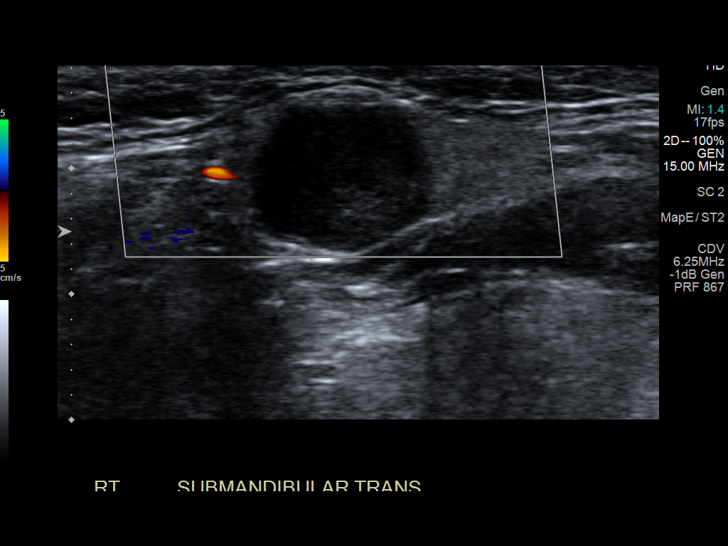
[im 4/12]
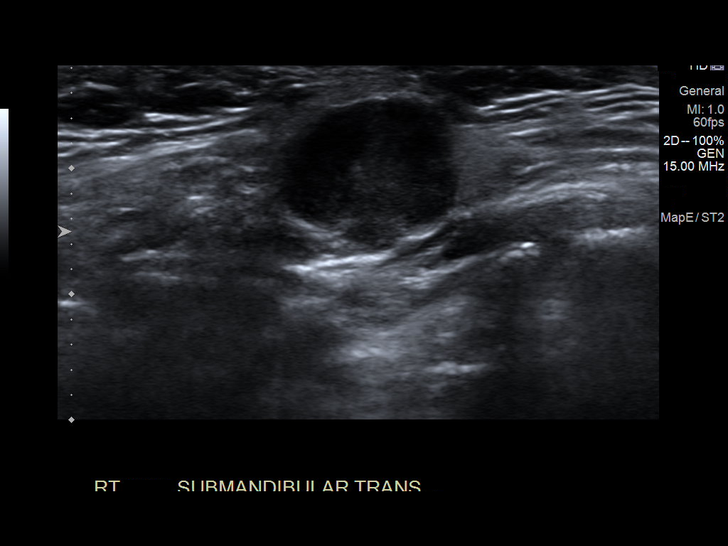
[im 5/12]
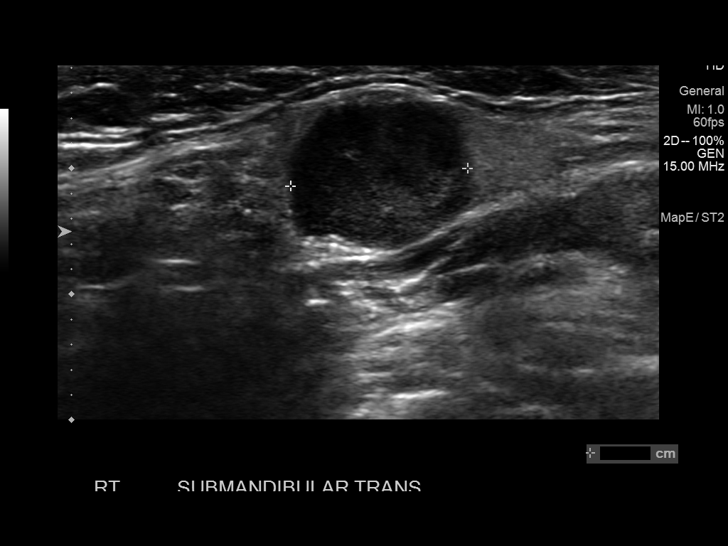
[im 6/12]
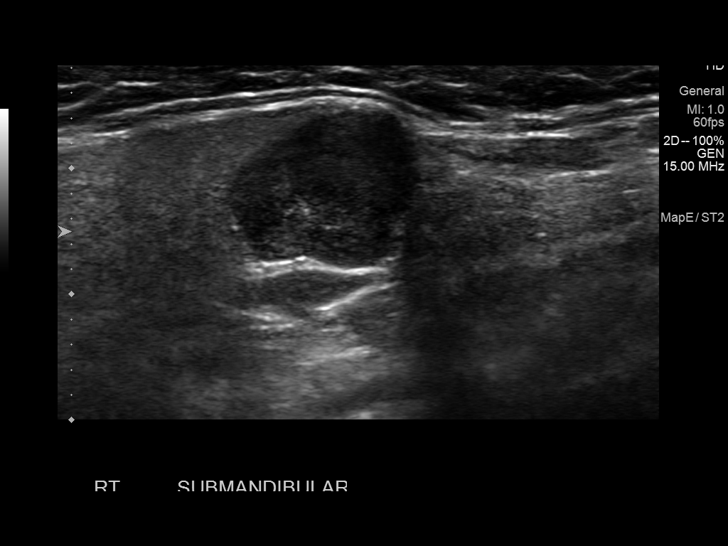
[im 7/12]
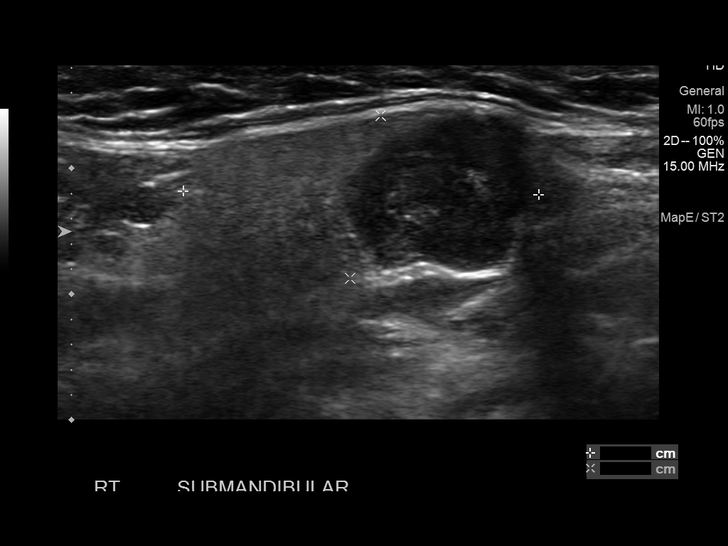
[im 8/12]
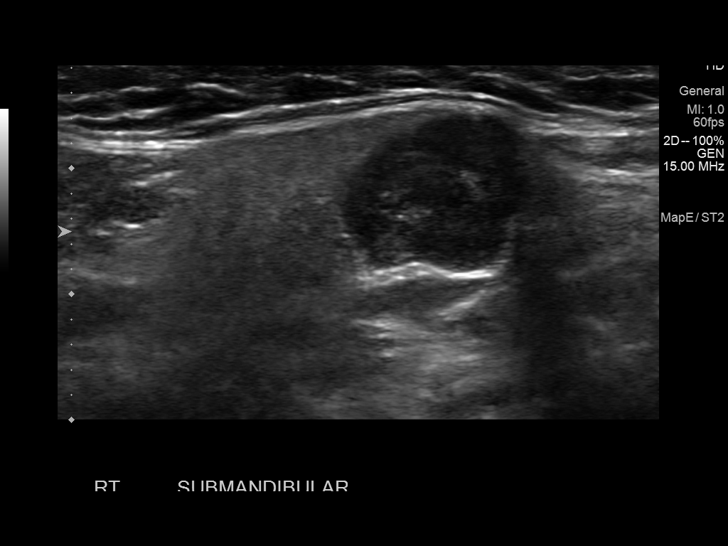
[im 9/12]
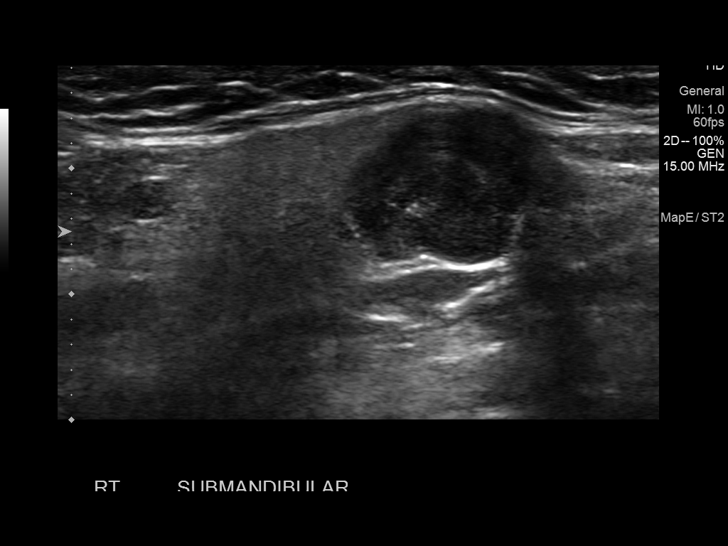
[im 10/12]
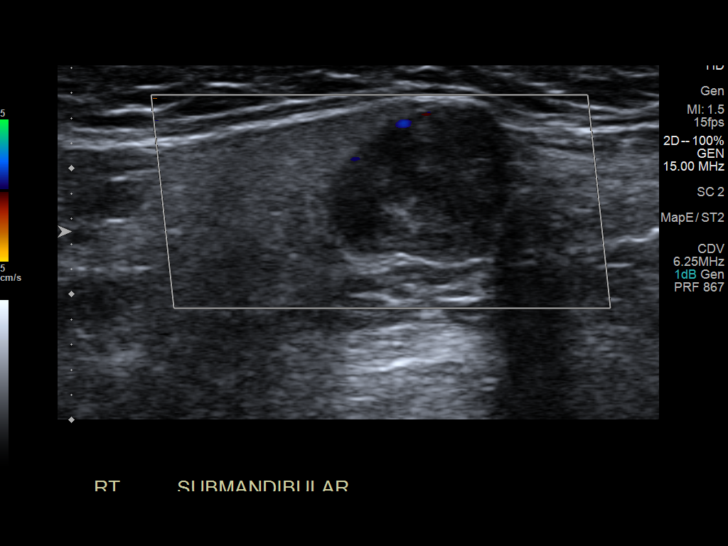
[im 11/12]
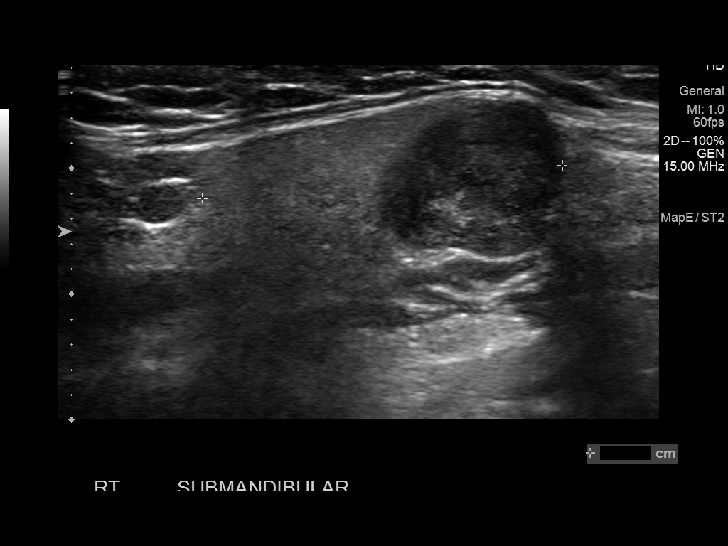
[im 12/12]
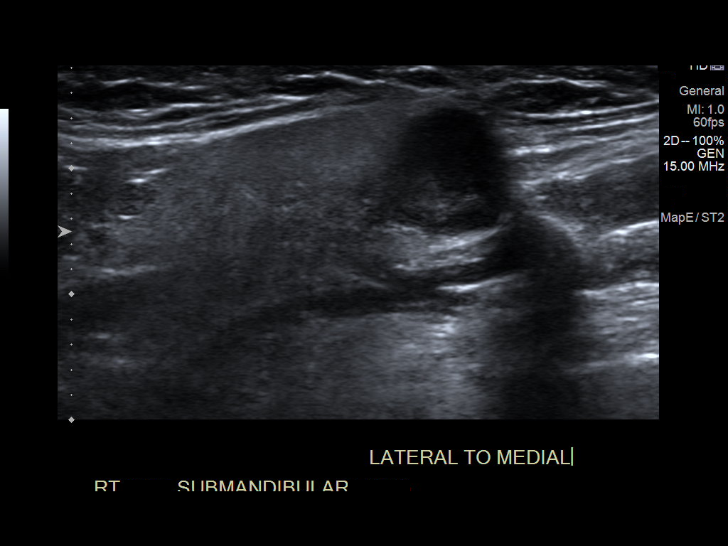

[12 of 12 positions shown; findings below may reference images not displayed]

FINDINGS: Ultrasound performed of the right submandibular area of
concern/palpable abnormality.

This correlates with a superficial hypoechoic solid nodular mass
measuring 1.4 x 1.2 x 1.4 cm either within the inferior aspect of
the right submandibular gland or just inferior to the gland. Lesion
is indeterminate for a submandibular mass versus abnormal
submandibular lymph node. Consider further evaluation with neck CT
with contrast. Lesion would be amenable to ultrasound biopsy.
IMPRESSION: 1.4 cm right submandibular solid hypoechoic mass versus
submandibular lymph node. See above comment and recommendation.

## 2022-03-19 NOTE — Progress Notes (Deleted)
Complete Physical   Assessment and Plan: Health Maintenance- Discussed STD testing, safe sex, alcohol and drug awareness, drinking and driving dangers, wearing a seat belt and general safety measures for young adult.  [name] was seen today for annual exam and see med list.  Diagnoses and all orders for this visit:  Encounter for annual physical exam Yearly -     CBC with Differential/Platelet -     COMPLETE METABOLIC PANEL WITH GFR  Obesity Long discussion about weight loss, diet, and exercise Recommended diet heavy in fruits and veggies and low in animal meats, cheeses, and dairy products, appropriate calorie intake Follow up at next visit   Tobacco abuse Discussed cessation  Screening, ischemic heart disease -     EKG 12-Lead  Screening for thyroid disorder -     TSH  Screening for blood or protein in urine -     Urinalysis routine with reflex microscopic - Microalbumin/creatinine urine ratio  Vitamin D deficiency No supplementation at this time -     VITAMIN D 25 Hydroxy (Vit-D Deficiency, Fractures)  Hyperlipidemia Continue diet and exercise -     Lipid panel  Abnormal glucose Continue diet and exercise -     Hemoglobin A1c  Primary insomnia No longer taking Trazodone, has a 76 month old baby boy   Gastroesophageal reflux disease without esophagitis  Continue dietary modifications and TUMS      Discussed med's effects and SE's. Screening labs and tests as requested with regular follow-up as recommended. Over 40 minutes of face to face interview,  exam, counseling, chart review and critical decision making was performed   HPI  This very nice 37 y.o.male presents for complete physical.   BP well controlled without medication BP Readings from Last 3 Encounters:  09/28/21 (!) 136/98  05/16/21 (!) 137/100  03/20/21 110/82    BMI is There is no height or weight on file to calculate BMI., he has not been working on diet and exercise.  Wt Readings  from Last 3 Encounters:  09/28/21 197 lb 6.4 oz (89.5 kg)  05/16/21 206 lb (93.4 kg)  03/20/21 210 lb (95.3 kg)   He continues to smoke 1 ppd.  He is not ready to quit.   Reports that he has a right side lymph node that is swollen, has been present x 2 years.  Reports non-tender but can be after messing with the area. Denies fluctuation of this, reports some swallowing difficulties. Denies any coughing with eating or drinking.   [name] lumbar discectomy & fusion  He is divorced, has two children 11 & 15. He has a partner, male, sexually active, declines STD exposure or screening today.  He does not workout.  Has been talking about increasing activity with girlfriend.  Finally, patient has history of Vitamin D Deficiency, not been taking supplementation at this time and last vitamin D was  Lab Results  Component Value Date   VD25OH 45 03/20/2021  .  Currently on supplementation  Current Medications:  Current Outpatient Medications on File Prior to Visit  Medication Sig Dispense Refill   albuterol (VENTOLIN HFA) 108 (90 Base) MCG/ACT inhaler Inhale 2 puffs into the lungs every 6 (six) hours as needed for wheezing or shortness of breath. 8 g 2   azithromycin (ZITHROMAX) 250 MG tablet Take 2 tablets (500 mg) on  Day 1,  followed by 1 tablet (250 mg) once daily on Days 2 through 5. 6 each 1   benzonatate (TESSALON PERLES) 100  MG capsule Take 1 capsule (100 mg total) by mouth every 6 (six) hours as needed for cough. 30 capsule 1   dexamethasone (DECADRON) 4 MG tablet Take 3 tabs for 3 days, 2 tabs for 3 days 1 tab for 5 days. Take with food. 20 tablet 0   hydroxychloroquine (PLAQUENIL) 200 MG tablet Take by mouth 2 (two) times daily.     promethazine-dextromethorphan (PROMETHAZINE-DM) 6.25-15 MG/5ML syrup Take 5 mLs by mouth 4 (four) times daily as needed for cough. 240 mL 1   No current facility-administered medications on file prior to visit.   Health Maintenance:    Immunization History  Administered Date(s) Administered   PFIZER(Purple Top)SARS-COV-2 Vaccination 11/20/2019, 12/23/2019   Tdap 10/17/2020    TD/TDAP: 10/2020 Influenza: Declines Pneumovax: N/A    Sexually Active: yes STD testing offered, declined Last Dental Exam: 08/2019 Last Eye Exam: DUE  Allergies: No Known Allergies Medical History:  has Tobacco abuse; Suicidal ideation; Obesity (BMI 30-39.9); Hyperlipidemia; Abnormal glucose; and Gastroesophageal reflux disease without esophagitis on their problem list. Surgical History:  He  has no past surgical history on file. Family History:  Hisfamily history includes CAD in his father. Social History:   reports that he has been smoking cigarettes. He has never used smokeless tobacco. He reports current alcohol use of about 2.0 standard drinks of alcohol per week. He reports that he does not use drugs.  Review of Systems: Review of Systems  Constitutional:  Negative for chills and fever.  HENT:  Negative for congestion, hearing loss, sinus pain, sore throat and tinnitus.        Right submandibular adenopathy  Eyes:  Negative for blurred vision and double vision.  Respiratory:  Negative for cough, hemoptysis, sputum production, shortness of breath and wheezing.   Cardiovascular:  Negative for chest pain, palpitations and leg swelling.  Gastrointestinal:  Negative for abdominal pain, constipation, diarrhea, heartburn, nausea and vomiting.  Genitourinary:  Negative for dysuria and urgency.  Musculoskeletal:  Negative for back pain, falls, joint pain, myalgias and neck pain.  Skin:  Positive for itching (right bottom foot, small area that is pruritic). Negative for rash.  Neurological:  Negative for dizziness, tingling, tremors, weakness and headaches.  Endo/Heme/Allergies:  Does not bruise/bleed easily.  Psychiatric/Behavioral:  Negative for depression and suicidal ideas. The patient is not nervous/anxious and does not have insomnia.      Physical Exam: Estimated body mass index is 28.32 kg/m as calculated from the following:   Height as of 09/28/21: '5\' 10"'$  (1.778 m).   Weight as of 09/28/21: 197 lb 6.4 oz (89.5 kg). There were no vitals taken for this visit.   General Appearance: Well nourished, in no apparent distress.  Eyes: PERRLA, EOMs, conjunctiva no swelling or erythema, normal fundi and vessels.  Sinuses: No Frontal/maxillary tenderness  ENT/Mouth: Ext aud canals clear, normal light reflex with TMs without erythema, bulging. Good dentition. No erythema, swelling, or exudate on post pharynx. Tonsils not swollen or erythematous. Hearing normal. Submandibular , right, marble in size. Neck: Supple, thyroid normal. No bruits  Respiratory: Respiratory effort normal, BS equal bilaterally without rales, rhonchi, wheezing or stridor.  Cardio: RRR without murmurs, rubs or gallops. Brisk peripheral pulses without edema.  Chest: symmetric, with normal excursions and percussion.  Abdomen: Soft, nontender, no guarding, rebound, hernias, masses, or organomegaly.  Lymphatics: Non tender without lymphadenopathy.  Genitourinary: defer Musculoskeletal: Full ROM all peripheral extremities,5/5 strength, and normal gait.  Skin: Warm, dry . 1cm flat scaly lesion bottom  of right foot, nontender Neuro: Cranial nerves intact, reflexes equal bilaterally. Normal muscle tone, no cerebellar symptoms. Sensation intact.  Psych: Awake and oriented X 3, normal affect, Insight and Judgment appropriate.    EKG: NSR, no ST changes  [facility]  03/19/2022

## 2022-03-20 ENCOUNTER — Encounter: Payer: 59 | Admitting: Nurse Practitioner

## 2022-04-08 IMAGING — US IR BIOPSY CORE SALIVARY GLAND
1 series · 13 of 17 positions shown · non-contrast
Comparison: none

CLINICAL DATA: Mass of right submandibular salivary gland.

[Series 1: us core biopsy (soft tissue) · 13 of 17 slices shown]
[im 1/17]
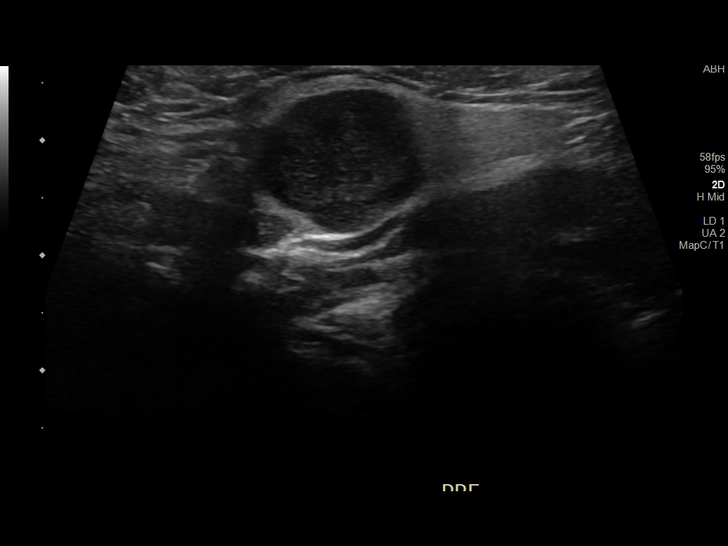
[im 2/17]
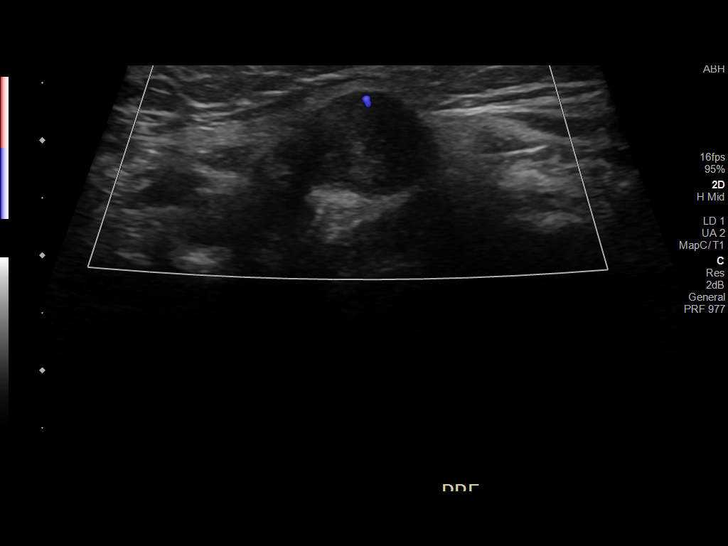
[im 4/17]
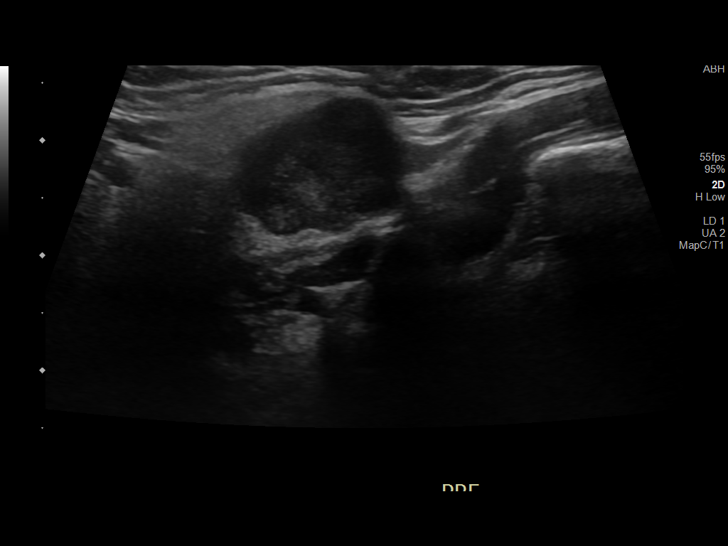
[im 5/17]
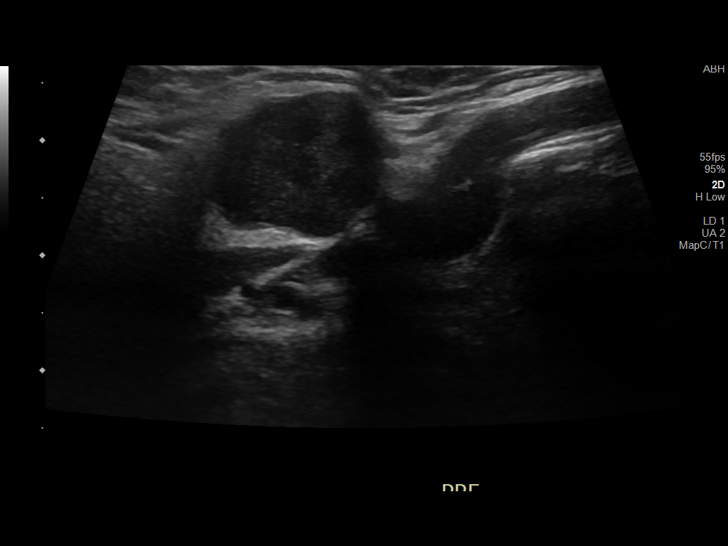
[im 6/17]
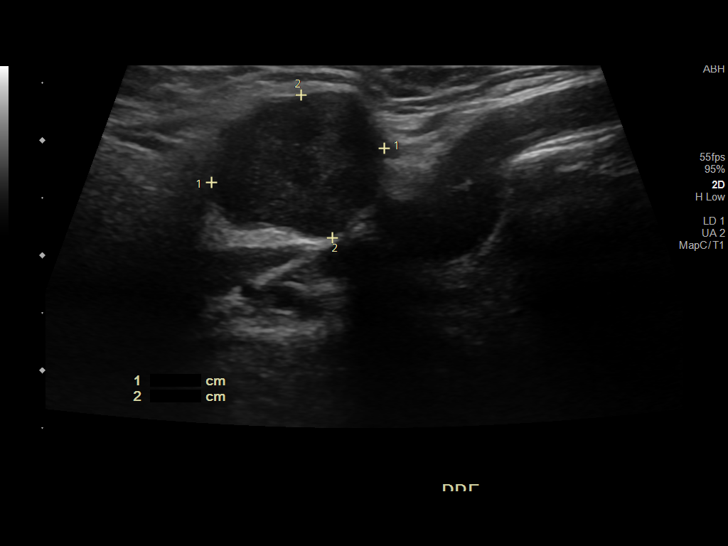
[im 8/17]
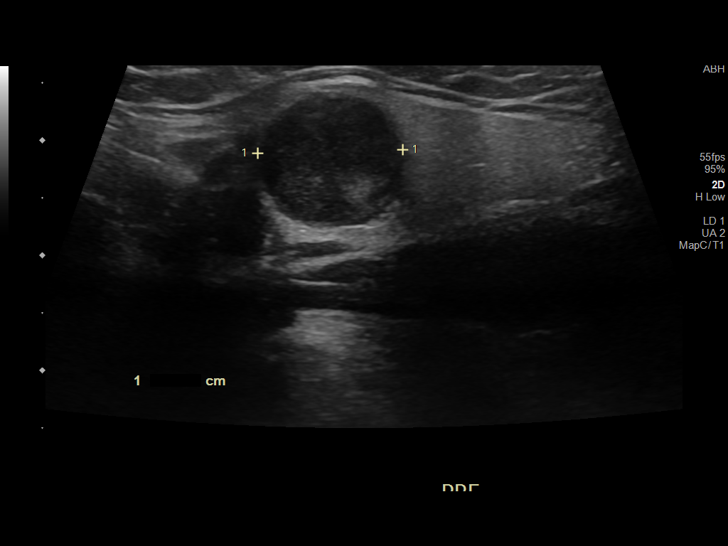
[im 9/17]
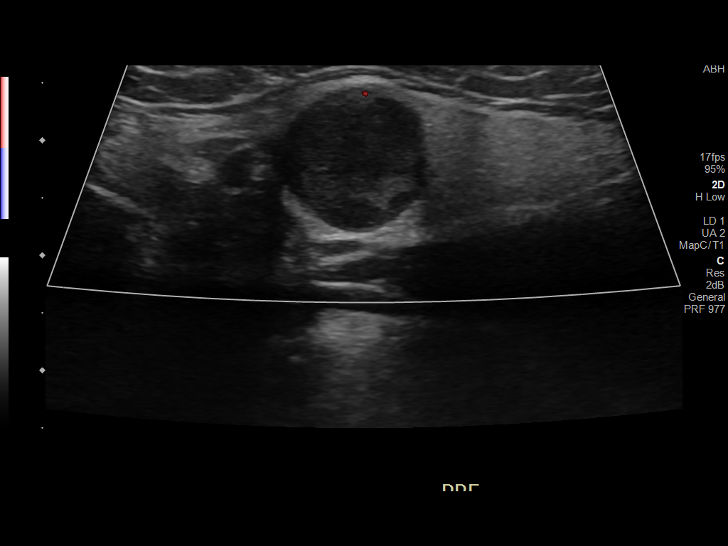
[im 10/17]
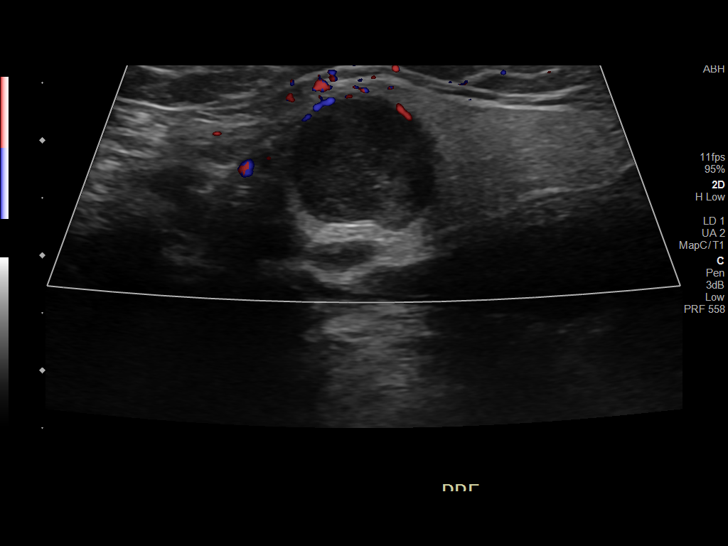
[im 12/17]
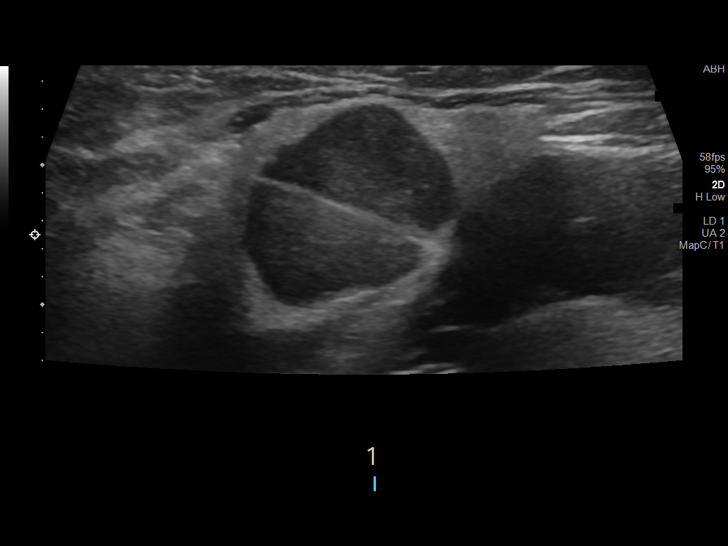
[im 13/17]
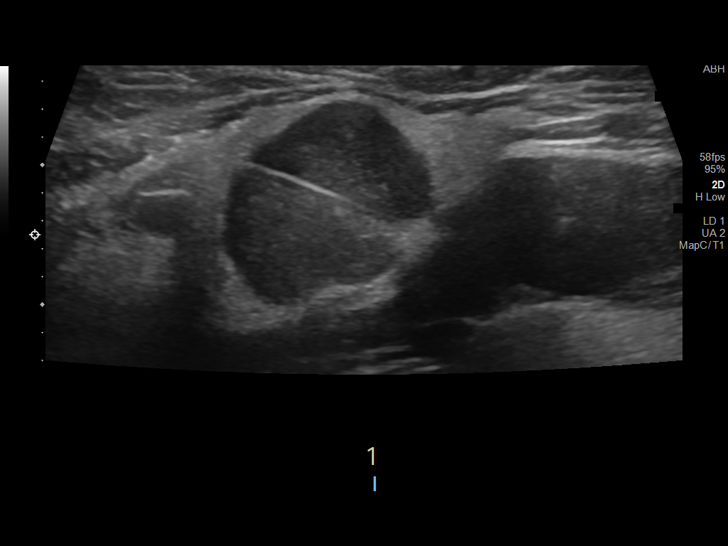
[im 14/17]
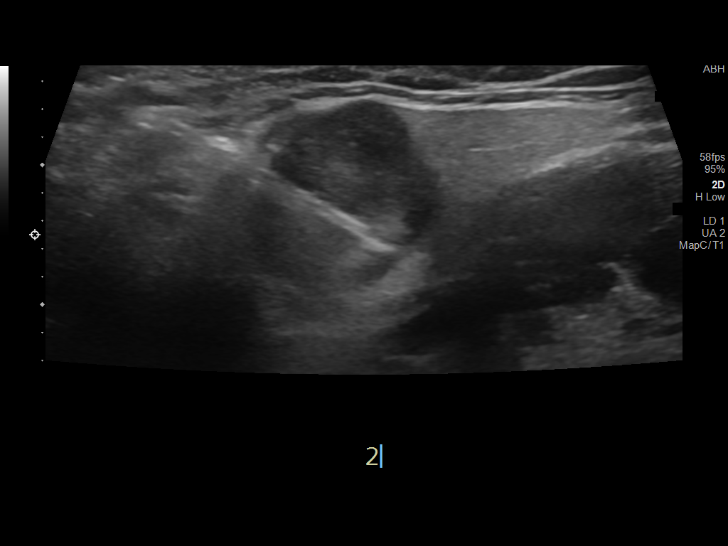
[im 16/17]
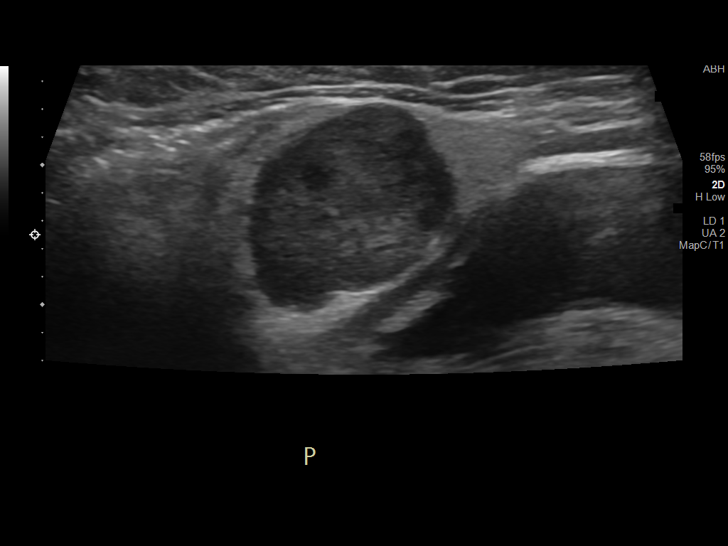
[im 17/17]
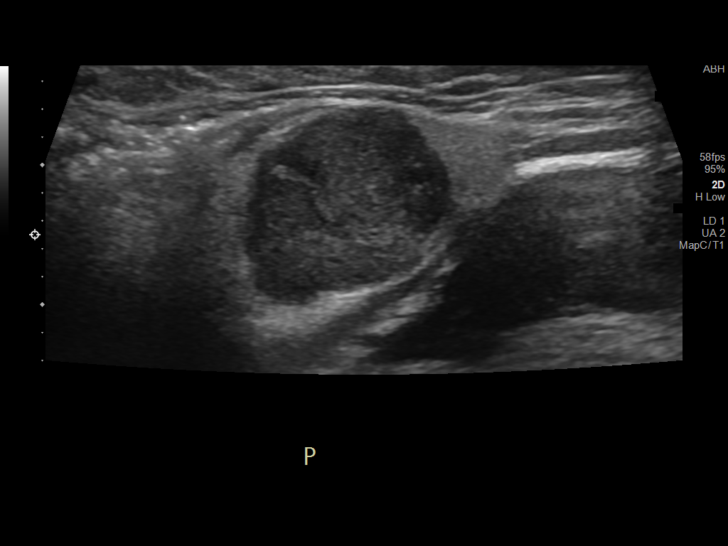

[13 of 17 positions shown; findings below may reference images not displayed]

EXAM:
ULTRASOUND GUIDED CORE BIOPSY OF RIGHT SUBMANDIBULAR GLAND MASS

MEDICATIONS:
Moderate (conscious) sedation was employed during this procedure. A
total of Versed 2.0 mg and Fentanyl 50 mcg was administered
intravenously by radiology nursing.

Moderate Sedation Time: 21 minutes. The patient's level of
consciousness and vital signs were monitored continuously by
radiology nursing throughout the procedure under my direct
supervision.

PROCEDURE:
The procedure, risks, benefits, and alternatives were explained to
the patient. Questions regarding the procedure were encouraged and
answered. The patient understands and consents to the procedure. A
time out was performed prior to initiating the procedure.

Ultrasound was used to localize a right submandibular gland mass.
The right upper neck was prepped with chlorhexidine in a sterile
fashion, and a sterile drape was applied covering the operative
field. A sterile gown and sterile gloves were used for the
procedure. Local anesthesia was provided with 1% Lidocaine.

An 18 gauge core biopsy device was utilized in obtaining core biopsy
samples of the right submandibular gland mass. Three separate core
biopsy samples were obtained and submitted in formalin. Additional
post biopsy ultrasound was performed.

COMPLICATIONS:
None.
FINDINGS: Initial imaging again demonstrates a rounded, solid hypoechoic mass
within the right submandibular gland measuring approximately 1.5 x
1.3 x 1.3 cm. Solid tissue biopsy samples were obtained.
IMPRESSION: Ultrasound-guided core biopsy performed of a 1.5 cm mass within the
right submandibular gland.
